# Patient Record
Sex: Female | Born: 1960 | Race: Black or African American | Hispanic: No | Marital: Single | Smoking: Current every day smoker
Health system: Southern US, Community
[De-identification: ages and names within clinical notes are randomized; demographics above are authoritative.]

## PROBLEM LIST (undated history)

## (undated) DIAGNOSIS — F419 Anxiety disorder, unspecified: Secondary | ICD-10-CM

## (undated) DIAGNOSIS — I10 Essential (primary) hypertension: Secondary | ICD-10-CM

## (undated) DIAGNOSIS — J449 Chronic obstructive pulmonary disease, unspecified: Secondary | ICD-10-CM

## (undated) HISTORY — DX: Essential (primary) hypertension: I10

---

## 1983-07-19 HISTORY — PX: RETINAL DETACHMENT SURGERY: SHX105

## 1993-07-18 HISTORY — PX: GANGLION CYST EXCISION: SHX1691

## 1997-11-07 ENCOUNTER — Emergency Department (HOSPITAL_COMMUNITY): Admission: EM | Admit: 1997-11-07 | Discharge: 1997-11-07 | Payer: Self-pay | Admitting: Emergency Medicine

## 1998-07-26 ENCOUNTER — Inpatient Hospital Stay (HOSPITAL_COMMUNITY): Admission: AD | Admit: 1998-07-26 | Discharge: 1998-07-28 | Payer: Self-pay | Admitting: *Deleted

## 1999-10-11 ENCOUNTER — Other Ambulatory Visit: Admission: RE | Admit: 1999-10-11 | Discharge: 1999-10-11 | Payer: Self-pay | Admitting: Obstetrics and Gynecology

## 1999-10-15 ENCOUNTER — Encounter: Payer: Self-pay | Admitting: Obstetrics and Gynecology

## 1999-10-15 ENCOUNTER — Ambulatory Visit (HOSPITAL_COMMUNITY): Admission: RE | Admit: 1999-10-15 | Discharge: 1999-10-15 | Payer: Self-pay | Admitting: Obstetrics and Gynecology

## 1999-11-05 ENCOUNTER — Ambulatory Visit (HOSPITAL_COMMUNITY): Admission: RE | Admit: 1999-11-05 | Discharge: 1999-11-05 | Payer: Self-pay | Admitting: Obstetrics and Gynecology

## 1999-11-05 ENCOUNTER — Encounter: Payer: Self-pay | Admitting: Obstetrics and Gynecology

## 1999-11-17 ENCOUNTER — Encounter (INDEPENDENT_AMBULATORY_CARE_PROVIDER_SITE_OTHER): Payer: Self-pay

## 1999-11-17 ENCOUNTER — Inpatient Hospital Stay (HOSPITAL_COMMUNITY): Admission: AD | Admit: 1999-11-17 | Discharge: 1999-11-19 | Payer: Self-pay | Admitting: Obstetrics and Gynecology

## 1999-11-20 ENCOUNTER — Encounter: Admission: RE | Admit: 1999-11-20 | Discharge: 2000-02-18 | Payer: Self-pay | Admitting: Obstetrics and Gynecology

## 2000-02-19 ENCOUNTER — Encounter: Admission: RE | Admit: 2000-02-19 | Discharge: 2000-05-19 | Payer: Self-pay | Admitting: Obstetrics and Gynecology

## 2000-05-21 ENCOUNTER — Encounter: Admission: RE | Admit: 2000-05-21 | Discharge: 2000-08-19 | Payer: Self-pay | Admitting: Obstetrics and Gynecology

## 2000-08-21 ENCOUNTER — Encounter: Admission: RE | Admit: 2000-08-21 | Discharge: 2000-09-26 | Payer: Self-pay | Admitting: Obstetrics and Gynecology

## 2006-01-30 ENCOUNTER — Encounter: Admission: RE | Admit: 2006-01-30 | Discharge: 2006-01-30 | Payer: Self-pay | Admitting: Internal Medicine

## 2009-11-27 ENCOUNTER — Encounter: Admission: RE | Admit: 2009-11-27 | Discharge: 2009-11-27 | Payer: Self-pay | Admitting: Family Medicine

## 2009-12-07 ENCOUNTER — Encounter: Admission: RE | Admit: 2009-12-07 | Discharge: 2009-12-07 | Payer: Self-pay | Admitting: Family Medicine

## 2010-08-07 ENCOUNTER — Encounter: Payer: Self-pay | Admitting: Internal Medicine

## 2010-08-08 ENCOUNTER — Encounter: Payer: Self-pay | Admitting: Internal Medicine

## 2010-12-03 NOTE — Discharge Summary (Signed)
[facility]  Patient:    Darlene Humphrey, Darlene Humphrey                        MRN: [id number] Adm. Date:  [id number] Disc. Date: [id number] Attending:  Michaele Offer                           Discharge Summary  ADMISSION DIAGNOSES:          Twin pregnancy at 34 weeks, preterm labor, desired surgical sterilization.  DISCHARGE DIAGNOSES:           Twin pregnancy at 34 weeks, preterm labor, desired surgical sterilization.  PROCEDURES:                     Spontaneous vaginal delivery x 2 of vertex-vertex twins and postpartum tubal ligation.  COMPLICATIONS:                None.  CONSULTATIONS:                None.  HISTORY OF PRESENT ILLNESS:   This is a 50 year old black female, gravida 4, para 1-0-2-1 with an EGA of [redacted] weeks by a 22-week ultrasound with an EDC of January 04, 2000, who presented with complaint of contractions for approximately 60 minutes without rupture of membranes with good fetal movement and no vaginal bleeding.  She had a known twin pregnancy and upon presentation to maternity admissions, she was completely dilated with a vertex presentation of twin A and had spontaneous rupture of membranes with possible meconium.  PRENATAL COURSE:              Prenatal care was complicated by late prenatal care, advanced maternal age with normal fetal ultrasound and she did not have a triple screen due to late care and she did not have an amniocentesis.  She had Trichomonas at her first visit on March 26, treated with metronidazole. Ultrasound on March 30 revealed vertex-vertex twins with weights of 1237 g and 1490 g and normal amniotic fluid.  She was last seen in the office on April 9 and fundal height was noted to be 35 cm.  PRENATAL LABORATORY DATA:     Blood type is O positive with a negative antibody screen.  Sickle screen is negative.  RPR nonreactive. Rubella-immune.  Hepatitis B surface antigen negative.  HIV negative. Gonorrhea and Chlamydia  negative and she did not have a one-hour Glucola or group B strep.  PAST OBSTETRICAL HISTORY:     In 1977 elective termination, in 1998 she had a spontaneous abortion, in 2000 she had a vaginal delivery at 40 weeks, 6 pounds 7 ounces without complications.  PAST MEDICAL HISTORY:         Significant for a history of eye surgery and she smoked about a quarter pack of cigarettes a day.  PHYSICAL EXAMINATION:  VITAL SIGNS:                  She is afebrile with stable vital signs.  Fetal heart tracing was reassuring on both twins and on the monitor she had regular contractions.  PELVIC:                       Vaginal examination was complete/complete and +2 by the nurse at maternity admissions.  HOSPITAL COURSE:  The patient presented completely dilated and was immediately brought to the operating room.  Once arrived in the operating room she pushed well.  Baby A delivered vertex and spontaneous delivery, viable female, Apgars of 8 and 9 that weighed 3 pounds 14 ounces with a nuchal cord which had delivered through.  Baby B was initially oblique and external cephalic version was successfully performed to bring the baby to vertex presentation with reassuring fetal heart tracing.  Artificial rupture of membranes the revealed clear fluid.  She then had a SVD of a viable female, Apgars 8 and 9 with a weight 4 pounds 6 ounces.  This baby had a nuchal cord which was reduced.  Placenta delivered spontaneous and was intact and did appear to one placenta.  The perineum had some small lacerations which were hemostatic and not repaired.  Estimated blood loss was 600 cc.  The neonatal team was present for both deliveries and both babies were taken to the neonatal intensive care unit in stable condition.  The patient did desire tubal ligation and after risks and options were discussed, she wished to proceed with this on postpartum day #1.  Predelivery hemoglobin was 12.0, post delivery was  9.8.  On May 3 she had a postpartum tubal ligation done under general anesthesia without complications.  On the morning of May 4 she was breast feeding her babies without complications.  Her tubal incision was healing well and she was felt to be stable enough for discharge home.  CONDITION ON DISCHARGE:       Stable.  DISPOSITION:                  Discharged to home.  DISCHARGE INSTRUCTIONS:       Her diet is a regular diet.  Her activity is pelvic rest.  FOLLOW-UP:                    Her follow-up is in four to six weeks.  DISCHARGE MEDICATIONS:        Percocet p.r.n. pain and she is given our discharge summary pamphlet. DD:  11/19/99 TD:  11/22/99 Job: [id number]

## 2010-12-03 NOTE — Op Note (Signed)
[facility]  Patient:    Darlene Humphrey, Darlene Humphrey                        MRN: [id number] Proc. Date: 11/18/99 Adm. Date:  [id number] Attending:  Michaele Offer                           Operative Report  PREOPERATIVE DIAGNOSIS:       Desired surgical sterility.  POSTOPERATIVE DIAGNOSIS:      Desired surgical sterility.  OPERATION:                    Bilateral partial salpingectomy.  SURGEON:                      Zenaida Niece, M.D.  ASSISTANT:  ANESTHESIA:                   General endotracheal anesthesia.  ESTIMATED BLOOD LOSS:         Less than 50 cc.  FINDINGS:                     Normal female anatomy.  COUNTS:                       Correct.  CONDITION:                    Stable.  DESCRIPTION OF PROCEDURE:     After appropriate informed consent was obtained, he patient was taken to the operating room and placed in the dorsal supine position. General anesthesia was induced and her abdomen was prepped and draped in the usual sterile fashion.  A 3 cm infraumbilical skin incision was made after the infraumbilical skin was infiltrated with 0.25% Marcaine.  The fascia was identified and entered sharply.  This also entered the peritoneal cavity.  Both fallopian tubes were identified and traced to their fimbriated ends.  A knuckle of tube was ligated with 0 plain gut suture on each side.  These knuckles of tube were removed sharply.  On both sides, both ostia were identified and the stumps were hemostatic. The fascia was closed with running 0 Vicryl and the skin closed with a running subcuticular suture of 3-0 Vicryl followed by a Band-aid.  The patient was awakened in the operating room, tolerated the procedure well, and was taken to the recovery room in stable condition. DD:  11/18/99 TD:  11/18/99 Job: [id number]

## 2013-07-29 ENCOUNTER — Other Ambulatory Visit: Payer: Self-pay | Admitting: Family Medicine

## 2013-07-29 ENCOUNTER — Other Ambulatory Visit: Payer: Self-pay

## 2013-07-29 ENCOUNTER — Ambulatory Visit
Admission: RE | Admit: 2013-07-29 | Discharge: 2013-07-29 | Disposition: A | Discharge status: Home / Self Care | Payer: Medicaid Other | Source: Ambulatory Visit | Attending: Family Medicine | Admitting: Family Medicine

## 2013-07-29 ENCOUNTER — Ambulatory Visit
Admission: RE | Admit: 2013-07-29 | Discharge: 2013-07-29 | Disposition: A | Discharge status: Home / Self Care | Payer: Medicaid Other | Source: Ambulatory Visit

## 2013-07-29 DIAGNOSIS — I10 Essential (primary) hypertension: Secondary | ICD-10-CM

## 2013-07-29 DIAGNOSIS — Z1231 Encounter for screening mammogram for malignant neoplasm of breast: Secondary | ICD-10-CM

## 2013-07-29 DIAGNOSIS — J209 Acute bronchitis, unspecified: Secondary | ICD-10-CM

## 2013-07-30 ENCOUNTER — Other Ambulatory Visit: Payer: Self-pay | Admitting: Family Medicine

## 2013-07-30 DIAGNOSIS — R9389 Abnormal findings on diagnostic imaging of other specified body structures: Secondary | ICD-10-CM

## 2013-07-30 DIAGNOSIS — H43399 Other vitreous opacities, unspecified eye: Secondary | ICD-10-CM

## 2013-08-02 ENCOUNTER — Ambulatory Visit
Admission: RE | Admit: 2013-08-02 | Discharge: 2013-08-02 | Disposition: A | Discharge status: Home / Self Care | Payer: Self-pay | Source: Ambulatory Visit | Attending: Family Medicine | Admitting: Family Medicine

## 2013-08-02 DIAGNOSIS — R9389 Abnormal findings on diagnostic imaging of other specified body structures: Secondary | ICD-10-CM

## 2013-08-21 LAB — CYTOLOGY - PAP: Pap: NEGATIVE

## 2013-08-28 ENCOUNTER — Encounter: Payer: Self-pay | Admitting: Gastroenterology

## 2013-10-10 ENCOUNTER — Ambulatory Visit (AMBULATORY_SURGERY_CENTER): Payer: Self-pay | Admitting: *Deleted

## 2013-10-10 VITALS — Ht 70.0 in | Wt 171.6 lb

## 2013-10-10 DIAGNOSIS — Z1211 Encounter for screening for malignant neoplasm of colon: Secondary | ICD-10-CM

## 2013-10-10 MED ORDER — NA SULFATE-K SULFATE-MG SULF 17.5-3.13-1.6 GM/177ML PO SOLN: 1.0000 | Freq: Once | ORAL | Status: DC

## 2013-10-10 NOTE — Progress Notes (Signed)
No allergies to eggs or soy. No problems with anesthesia.  

## 2013-10-24 ENCOUNTER — Encounter: Payer: Self-pay | Admitting: Gastroenterology

## 2013-10-24 ENCOUNTER — Ambulatory Visit (AMBULATORY_SURGERY_CENTER): Payer: Medicaid Other | Admitting: Gastroenterology

## 2013-10-24 VITALS — BP 147/81 | HR 54 | Temp 98.0°F | Resp 17 | Ht 70.0 in | Wt 171.0 lb

## 2013-10-24 DIAGNOSIS — D126 Benign neoplasm of colon, unspecified: Secondary | ICD-10-CM

## 2013-10-24 DIAGNOSIS — Z1211 Encounter for screening for malignant neoplasm of colon: Secondary | ICD-10-CM

## 2013-10-24 DIAGNOSIS — K5730 Diverticulosis of large intestine without perforation or abscess without bleeding: Secondary | ICD-10-CM

## 2013-10-24 MED ORDER — SODIUM CHLORIDE 0.9 % IV SOLN: 500.0000 mL | INTRAVENOUS | Status: DC

## 2013-10-24 NOTE — Progress Notes (Signed)
Called to room to assist during endoscopic procedure.  Patient ID and intended procedure confirmed with present staff. Received instructions for my participation in the procedure from the performing physician.  

## 2013-10-24 NOTE — Patient Instructions (Signed)
YOU HAD AN ENDOSCOPIC PROCEDURE TODAY AT THE [facility]: Refer to the procedure report that was given to you for any specific questions about what was found during the examination.  If the procedure report does not answer your questions, please call your gastroenterologist to clarify.  If you requested that your care partner not be given the details of your procedure findings, then the procedure report has been included in a sealed envelope for you to review at your convenience later.  YOU SHOULD EXPECT: Some feelings of bloating in the abdomen. Passage of more gas than usual.  Walking can help get rid of the air that was put into your GI tract during the procedure and reduce the bloating. If you had a lower endoscopy (such as a colonoscopy or flexible sigmoidoscopy) you may notice spotting of blood in your stool or on the toilet paper. If you underwent a bowel prep for your procedure, then you may not have a normal bowel movement for a few days.  DIET: Your first meal following the procedure should be a light meal and then it is ok to progress to your normal diet.  A half-sandwich or bowl of soup is an example of a good first meal.  Heavy or fried foods are harder to digest and may make you feel nauseous or bloated.  Likewise meals heavy in dairy and vegetables can cause extra gas to form and this can also increase the bloating.  Drink plenty of fluids but you should avoid alcoholic beverages for 24 hours.  ACTIVITY: Your care partner should take you home directly after the procedure.  You should plan to take it easy, moving slowly for the rest of the day.  You can resume normal activity the day after the procedure however you should NOT DRIVE or use heavy machinery for 24 hours (because of the sedation medicines used during the test).    SYMPTOMS TO REPORT IMMEDIATELY: A gastroenterologist can be reached at any hour.  During normal business hours, 8:30 AM to 5:00 PM Monday through Friday,  call [phone].  After hours and on weekends, please call the GI answering service at [phone] who will take a message and have the physician on call contact you.   Following lower endoscopy (colonoscopy or flexible sigmoidoscopy):  Excessive amounts of blood in the stool  Significant tenderness or worsening of abdominal pains  Swelling of the abdomen that is new, acute  Fever of 100F or higher   FOLLOW UP: If any biopsies were taken you will be contacted by phone or by letter within the next 1-3 weeks.  Call your gastroenterologist if you have not heard about the biopsies in 3 weeks.  Our staff will call the home number listed on your records the next business day following your procedure to check on you and address any questions or concerns that you may have at that time regarding the information given to you following your procedure. This is a courtesy call and so if there is no answer at the home number and we have not heard from you through the emergency physician on call, we will assume that you have returned to your regular daily activities without incident.  SIGNATURES/CONFIDENTIALITY: You and/or your care partner have signed paperwork which will be entered into your electronic medical record.  These signatures attest to the fact that that the information above on your After Visit Summary has been reviewed and is understood.  Full responsibility of the confidentiality of   this discharge information lies with you and/or your care-partner.   Resume medications. Information given on polyps,diverticulosis and high fiber diet with discharge instructions. 

## 2013-10-24 NOTE — Progress Notes (Signed)
A/ox3 pleased with MAC, report to Sheila RN 

## 2013-10-24 NOTE — Op Note (Signed)
[facility]  [address] [place], [id number]   COLONOSCOPY PROCEDURE REPORT  PATIENT: Darlene Humphrey, Darlene Humphrey  MR#: [id number] BIRTHDATE: 05-04-1961 , 53  yrs. old GENDER: Female ENDOSCOPIST: Inda Castle, MD REFERRED LN:[id number] Blount, M.D. PROCEDURE DATE:  10/24/2013 PROCEDURE:   Colonoscopy with snare polypectomy and Colonoscopy with biopsy First Screening Colonoscopy - Avg.  risk and is 50 yrs.  old or older Yes.  Prior Negative Screening - Now for repeat screening. N/A  History of Adenoma - Now for follow-up colonoscopy & has been > or = to 3 yrs.  N/A  Polyps Removed Today? Yes. ASA CLASS:   Class II INDICATIONS:average risk screening. MEDICATIONS: MAC sedation, administered by CRNA and propofol (Diprivan) 350mg  IV  DESCRIPTION OF PROCEDURE:   After the risks benefits and alternatives of the procedure were thoroughly explained, informed consent was obtained.  A digital rectal exam revealed no abnormalities of the rectum.   The [id number]  endoscope was introduced through the anus and advanced to the cecum, which was identified by both the appendix and ileocecal valve. No adverse events experienced.   The quality of the prep was Suprep good  The instrument was then slowly withdrawn as the colon was fully examined.      COLON FINDINGS: Two sessile polyps ranging between 3-52mm in size were found in the transverse colon.  A polypectomy was performed. The resection was complete and the polyp tissue was completely retrieved.   Multiple sessile polyps were found in the sigmoid colon.   Mild diverticulosis was noted in the sigmoid colon.   The colon mucosa was otherwise normal. measuring 5-12 mm.  The largest polyp was removed with cold snare and submitted to pathology. Biopsies were taken from the other polyps to rule out adenomatous changes versus hyperplastic changes. Retroflexed views revealed no abnormalities. The time to cecum=3 minutes 35 seconds.   Withdrawal time=19 minutes 43 seconds.  The scope was withdrawn and the procedure completed. COMPLICATIONS: There were no complications.  ENDOSCOPIC IMPRESSION: 1.   Two sessile polyps ranging between 3-25mm in size were found in the transverse colon; polypectomy was performed 2.   Multiple sessile polyps were found in the sigmoid colon (hyperplastic versus adenomatous) 3.   Mild diverticulosis was noted in the sigmoid colon 4.   The colon mucosa was otherwise normal  RECOMMENDATIONS: If the polyp(s) in the transverse colon removed today are proven to be adenomatous (pre-cancerous) polyps, you will need a repeat colonoscopy in 5 years.  Otherwise you should continue to follow colorectal cancer screening guidelines for "routine risk" patients with colonoscopy in 10 years. If polyps in the sigmoid colon are precancerous, you'll require a followup sigmoidoscopy within 3 months to remove these polyps. You will receive a letter within 1-2 weeks with the results of your biopsy as well. as final recommendations.  Please call my office if you have not received a letter after 3 weeks.   eSigned:  Inda Castle, MD 10/24/2013 10:46 AM   cc:   PATIENT NAME:  Darlene Humphrey, Darlene Humphrey MR#: [id number]

## 2013-10-25 ENCOUNTER — Telehealth: Payer: Self-pay | Admitting: *Deleted

## 2013-10-25 NOTE — Telephone Encounter (Signed)
  Follow up Call-  Call back number 10/24/2013  Post procedure Call Back phone  # [phone]  Permission to leave phone message No     Patient questions:  Do you have a fever, pain , or abdominal swelling? no Pain Score  0 *  Have you tolerated food without any problems? yes  Have you been able to return to your normal activities? yes  Do you have any questions about your discharge instructions: Diet   no Medications  no Follow up visit  no  Do you have questions or concerns about your Care? no  Actions: * If pain score is 4 or above: No action needed, pain <4.

## 2013-10-29 ENCOUNTER — Encounter: Payer: Self-pay | Admitting: Gastroenterology

## 2015-06-02 IMAGING — CT CT CHEST W/O CM
3 of 4 series · 17 of 30 positions shown, 19 images · non-contrast
Comparison: DG CHEST 2 VIEW dated 07/29/2013; DG CHEST 2 VIEW dated
11/27/2009

CLINICAL DATA: Tobacco use. Abnormal chest radiograph showing
opacity projecting over the cardiac shadow.

EXAM:
CT CHEST WITHOUT CONTRAST
TECHNIQUE: Multidetector CT imaging of the chest was performed following the
standard protocol without IV contrast.

[Series 3: chest w/o · axial · non-contrast · 0.77mm/px · z∈[-265,-55]mm · 4 of 72 slices shown]
[im 15/72  lung]
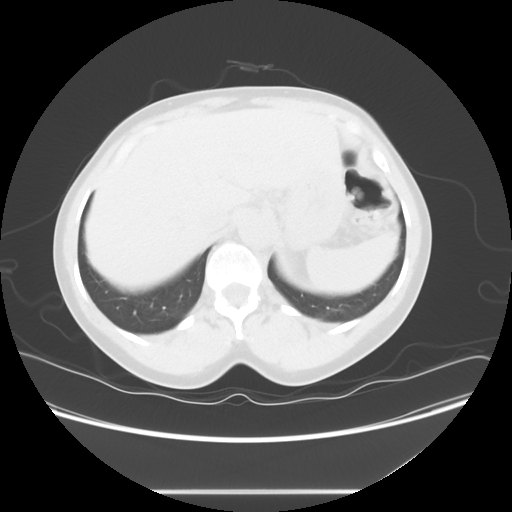
[im 29/72  lung]
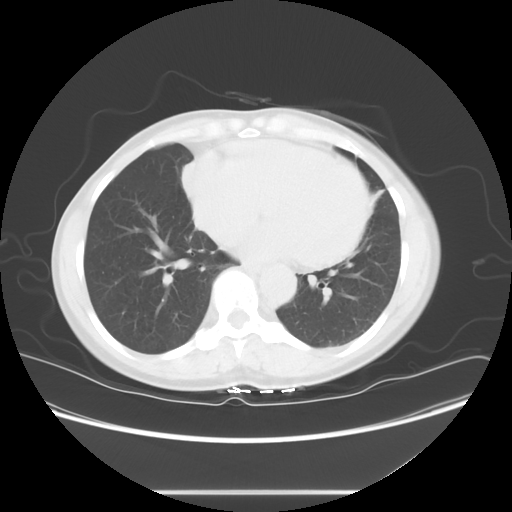
[im 43/72  lung]
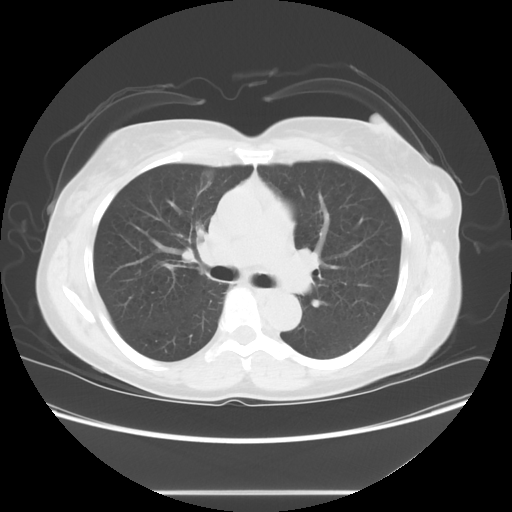
[im 57/72  lung]
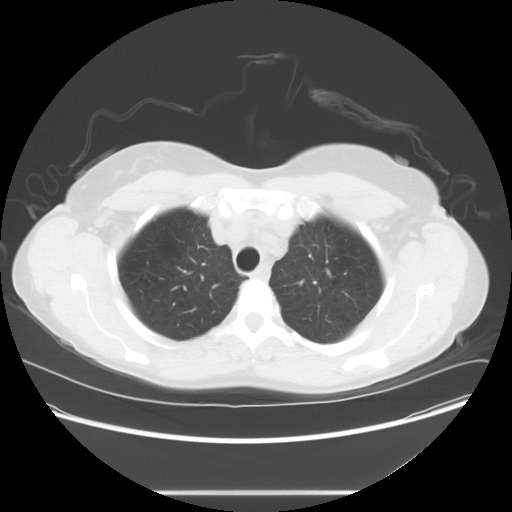

[Series 4: lung windows · axial · 0.77mm/px · z∈[-280,-40]mm · 5 of 72 slices shown, 7 images]
[im 12/72  mediastinal]
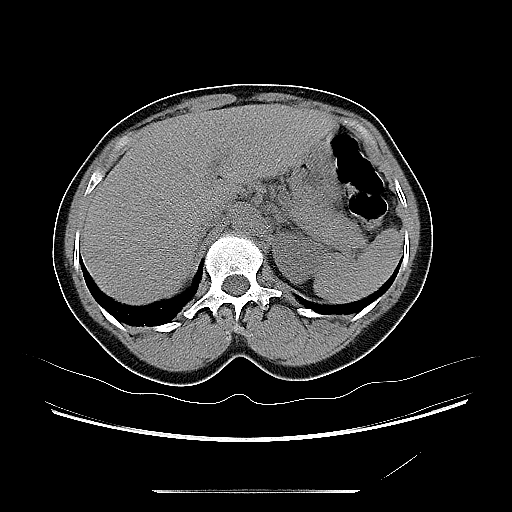
[im 12/72  lung]
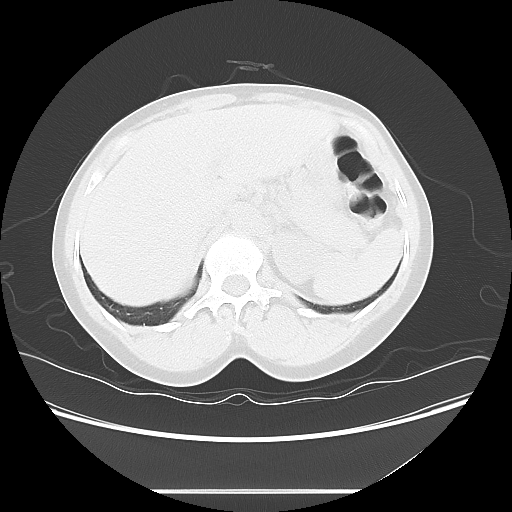
[im 24/72  lung]
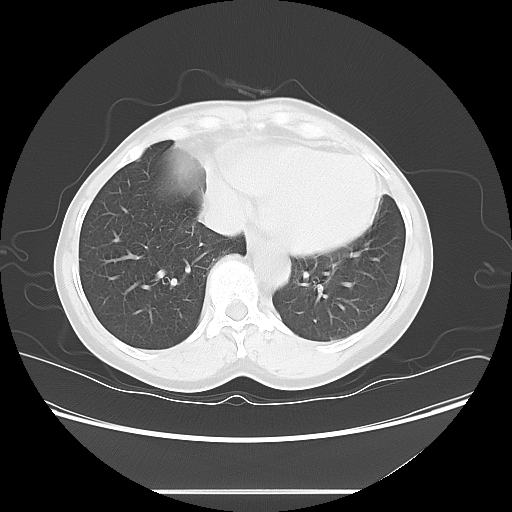
[im 36/72  lung]
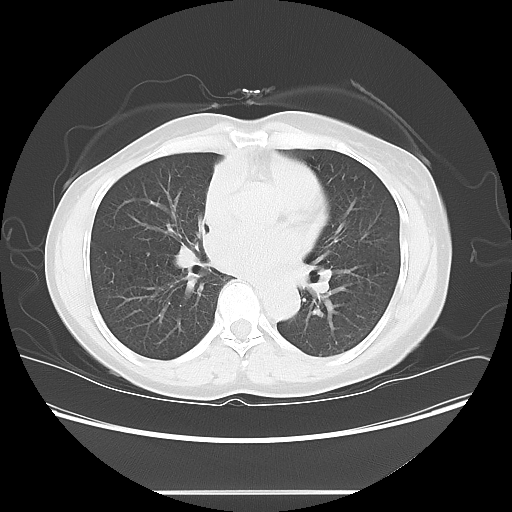
[im 48/72  lung]
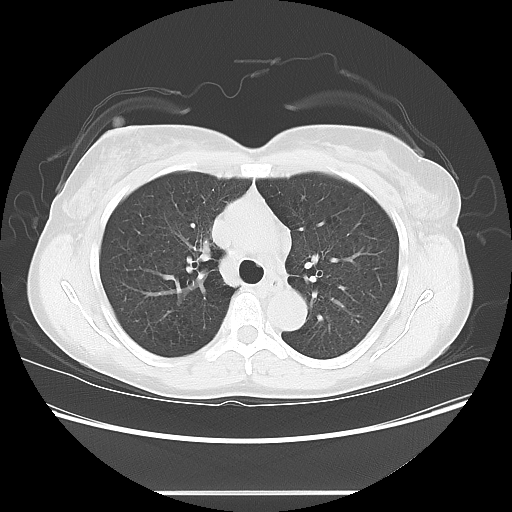
[im 60/72  mediastinal]
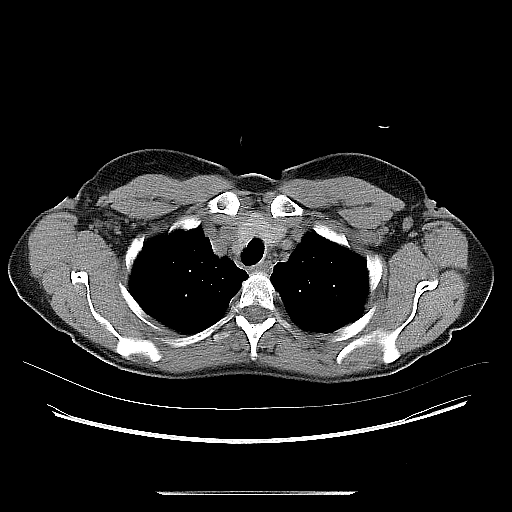
[im 60/72  lung]
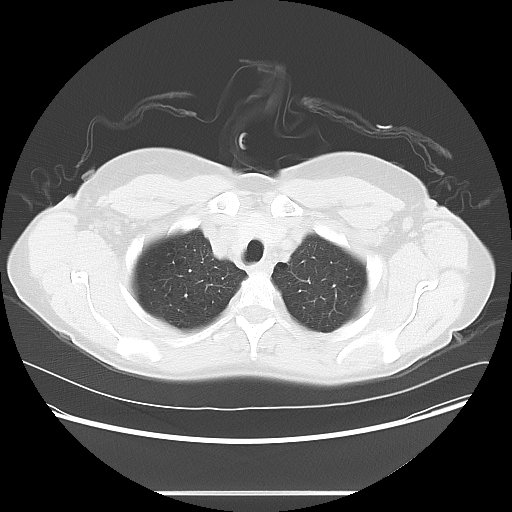

[Series 602: sagittal body · sagittal · 0.77mm/px · 8 of 158 slices shown]
[im 12/158  mediastinal]
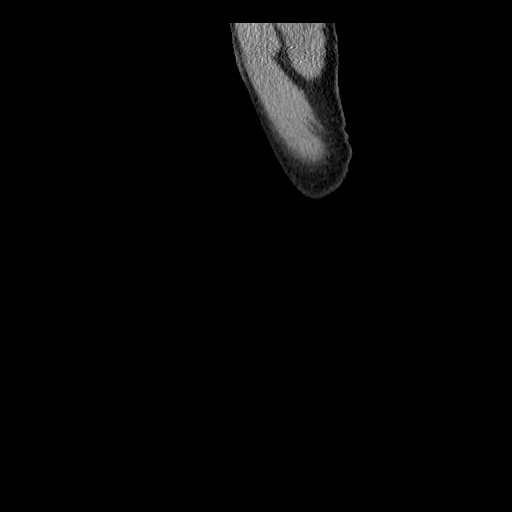
[im 34/158  mediastinal]
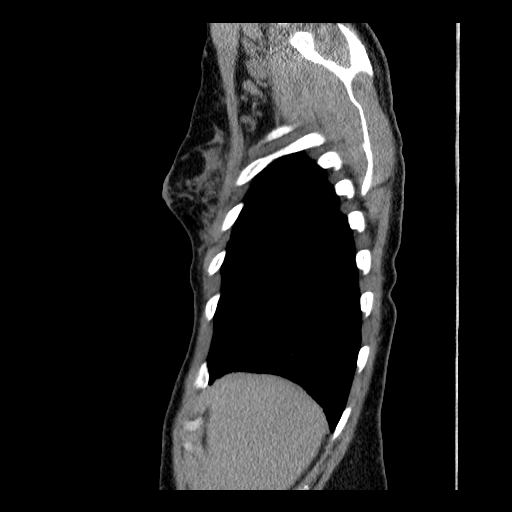
[im 57/158  mediastinal]
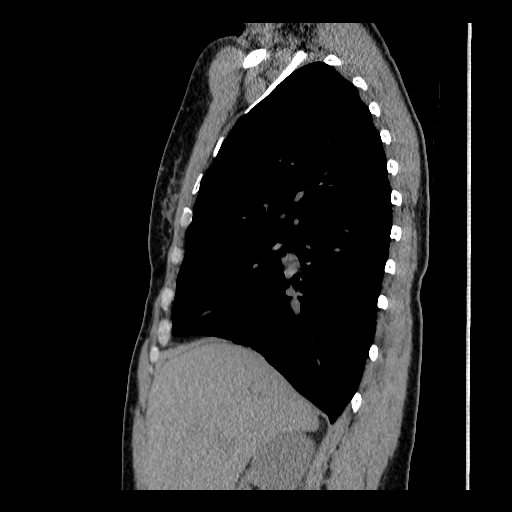
[im 68/158  mediastinal]
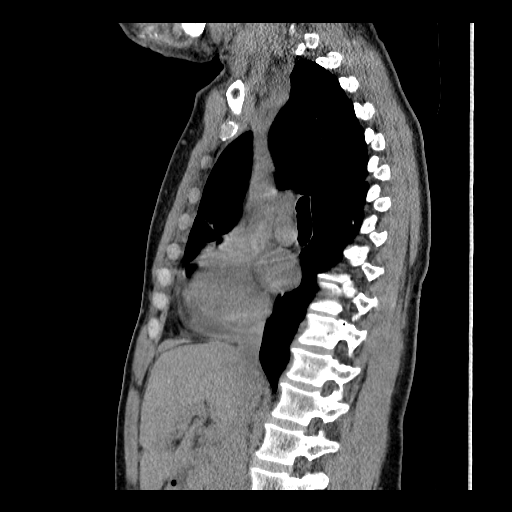
[im 90/158  mediastinal]
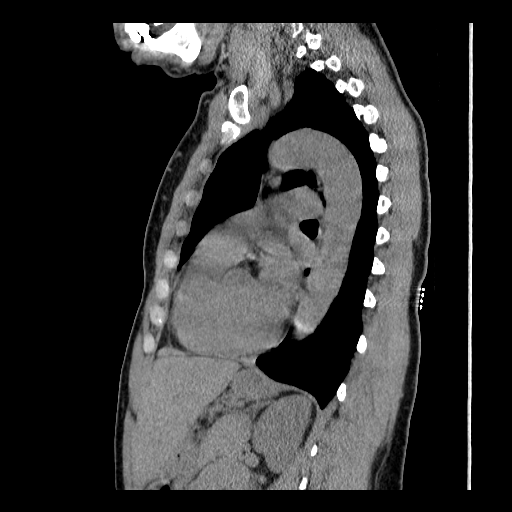
[im 101/158  mediastinal]
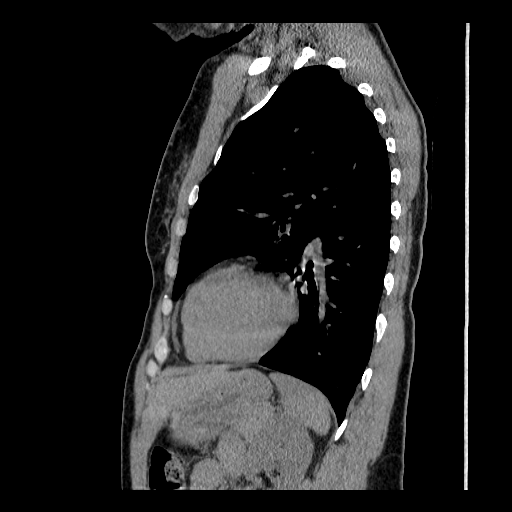
[im 124/158  mediastinal]
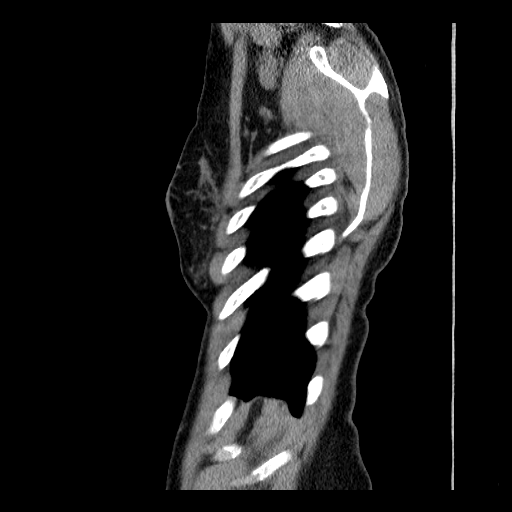
[im 146/158  mediastinal]
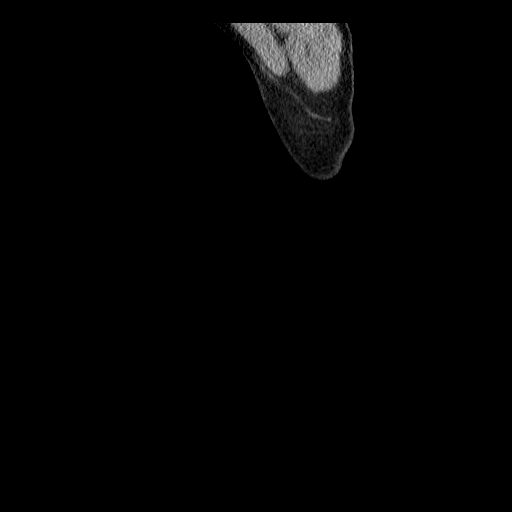

[17 of 30 positions shown; findings below may reference images not displayed]

FINDINGS: Scattered small mediastinal lymph nodes not pathologically enlarged
by size criteria. The heart is mildly enlarged. No pericardial
effusion. No pleural effusion.

Diffuse centrilobular emphysema. There is mild atelectasis medially
in the right middle lobe and in the lingula, and the combination of
the two in conjunction with epicardial adipose tissue is thought to
correlate with the density seen on the lateral chest radiography. No
bronchial lesion leading to these regions of atelectasis is
observed.

Tiny subpleural lymph node along the right major fissure
incidentally noted.

There is a 4 mm peripheral pulmonary nodule in the left lower lobe.

Lower cervical spondylosis noted.
IMPRESSION: 1. The opacity questioned on chest radiography represents
combination of mild lingular and right middle lobe atelectasis in
conjunction with epicardial adipose tissue. No definite specific
lesion causing this mild atelectasis is observed.
2. Centrilobular emphysema.
3. 4 mm peripheral left lower lobe lung nodule. Follow-up chest CT
at 8year is recommended. This recommendation follows the consensus
statement: Guidelines for Management of Small Pulmonary Nodules
Detected on CT Scans: A Statement from the [HOSPITAL] as
published in Radiology 0555; [DATE].
4. Mild cardiomegaly.

## 2015-08-11 ENCOUNTER — Encounter (HOSPITAL_COMMUNITY): Payer: Self-pay | Admitting: *Deleted

## 2015-08-11 ENCOUNTER — Emergency Department (HOSPITAL_COMMUNITY)
Admission: EM | Admit: 2015-08-11 | Discharge: 2015-08-11 | Disposition: A | Discharge status: Home / Self Care | Payer: Medicaid Other | Source: Home / Self Care

## 2015-08-11 DIAGNOSIS — S61011A Laceration without foreign body of right thumb without damage to nail, initial encounter: Secondary | ICD-10-CM | POA: Diagnosis not present

## 2015-08-11 MED ORDER — LIDOCAINE HCL 2 % IJ SOLN
INTRAMUSCULAR | Status: AC
  Filled 2015-08-11: qty 20

## 2015-08-11 NOTE — Discharge Instructions (Signed)
Laceration Care, Adult  A laceration is a cut that goes through all layers of the skin. The cut also goes into the tissue that is right under the skin. Some cuts heal on their own. Others need to be closed with stitches (sutures), staples, skin adhesive strips, or wound glue. Taking care of your cut lowers your risk of infection and helps your cut to heal better.  HOW TO TAKE CARE OF YOUR CUT  For stitches or staples:  · Keep the wound clean and dry.  · If you were given a bandage (dressing), you should change it at least one time per day or as told by your doctor. You should also change it if it gets wet or dirty.  · Keep the wound completely dry for the first 24 hours or as told by your doctor. After that time, you may take a shower or a bath. However, make sure that the wound is not soaked in water until after the stitches or staples have been removed.  · Clean the wound one time each day or as told by your doctor:    Wash the wound with soap and water.    Rinse the wound with water until all of the soap comes off.    Pat the wound dry with a clean towel. Do not rub the wound.  · After you clean the wound, put a thin layer of antibiotic ointment on it as told by your doctor. This ointment:    Helps to prevent infection.    Keeps the bandage from sticking to the wound.  · Have your stitches or staples removed as told by your doctor.  If your doctor used skin adhesive strips:   · Keep the wound clean and dry.  · If you were given a bandage, you should change it at least one time per day or as told by your doctor. You should also change it if it gets dirty or wet.  · Do not get the skin adhesive strips wet. You can take a shower or a bath, but be careful to keep the wound dry.  · If the wound gets wet, pat it dry with a clean towel. Do not rub the wound.  · Skin adhesive strips fall off on their own. You can trim the strips as the wound heals. Do not remove any strips that are still stuck to the wound. They will  fall off after a while.  If your doctor used wound glue:  · Try to keep your wound dry, but you may briefly wet it in the shower or bath. Do not soak the wound in water, such as by swimming.  · After you take a shower or a bath, gently pat the wound dry with a clean towel. Do not rub the wound.  · Do not do any activities that will make you really sweaty until the skin glue has fallen off on its own.  · Do not apply liquid, cream, or ointment medicine to your wound while the skin glue is still on.  · If you were given a bandage, you should change it at least one time per day or as told by your doctor. You should also change it if it gets dirty or wet.  · If a bandage is placed over the wound, do not let the tape for the bandage touch the skin glue.  · Do not pick at the glue. The skin glue usually stays on for 5-10 days. Then, it   or when wound glue stays in place and the wound is healed. Make sure to wear a sunscreen of at least 30 SPF.  Take over-the-counter and prescription medicines only as told by your doctor.  If you were given antibiotic medicine or ointment, take or apply it as told by your doctor. Do not stop using the antibiotic even if your wound is getting better.  Do not scratch or pick at the wound.  Keep all follow-up visits as told by your doctor. This is important.  Check your wound every day for signs of infection. Watch for:  Redness, swelling, or pain.  Fluid, blood, or pus.  Raise (elevate) the injured area above the level of your heart while you are sitting or lying down, if possible. GET HELP IF:  You got a tetanus shot and you have any of these problems at the injection site:  Swelling.  Very bad pain.  Redness.  Bleeding.  You have a fever.  A wound that was  closed breaks open.  You notice a bad smell coming from your wound or your bandage.  You notice something coming out of the wound, such as wood or glass.  Medicine does not help your pain.  You have more redness, swelling, or pain at the site of your wound.  You have fluid, blood, or pus coming from your wound.  You notice a change in the color of your skin near your wound.  You need to change the bandage often because fluid, blood, or pus is coming from the wound.  You start to have a new rash.  You start to have numbness around the wound. GET HELP RIGHT AWAY IF:  You have very bad swelling around the wound.  Your pain suddenly gets worse and is very bad.  You notice painful lumps near the wound or on skin that is anywhere on your body.  You have a red streak going away from your wound.  The wound is on your hand or foot and you cannot move a finger or toe like you usually can.  The wound is on your hand or foot and you notice that your fingers or toes look pale or bluish.   This information is not intended to replace advice given to you by your health care provider. Make sure you discuss any questions you have with your health care provider.   Nice to meet you. Please keep big dressing in place for 48 hours, then can remove top layer and let water gently run over it. Do not submerge, then keep dry and covered until f/u in 7 days for suture removal. May take Ibuprofen 800mg  every 8 hours and ES tylenol every 4 as needed.    Document Released: 12/21/2007 Document Revised: 11/18/2014 Document Reviewed: 06/30/2014 Elsevier Interactive Patient Education [employer].

## 2015-08-11 NOTE — ED Notes (Signed)
Pt  Reports     Laceration       To    r  Thumb     Sustained   When  She  Was  Moving a  Washing  Machine       And  Snagged  It  On  Metal  Edge

## 2015-08-11 NOTE — ED Provider Notes (Signed)
CSN: [id number]     Arrival date & time 08/11/15  1854 History   None    Chief Complaint  Patient presents with  . Laceration   (Consider location/radiation/quality/duration/timing/severity/associated sxs/prior Treatment) HPI Comments: Patient presents with laceration to right thumb while trying to move a washing machine and snagged it on metal edge.   The history is provided by the patient.    Past Medical History  Diagnosis Date  . Hypertension    Past Surgical History  Procedure Laterality Date  . Ganglion cyst excision Right 1995    wrist  . Retinal detachment surgery Left 1985   Family History  Problem Relation Age of Onset  . Colon cancer Neg Hx    Social History  Substance Use Topics  . Smoking status: Current Every Day Smoker -- 0.25 packs/day    Types: Cigarettes  . Smokeless tobacco: Never Used  . Alcohol Use: 1.8 oz/week    3 Cans of beer per week   OB History    No data available     Review of Systems  All other systems reviewed and are negative.   Allergies  Review of patient's allergies indicates no known allergies.  Home Medications   Prior to Admission medications   Medication Sig Start Date End Date Taking? Authorizing Provider  furosemide (LASIX) 20 MG tablet Take 20 mg by mouth daily.    Historical Provider, MD  lisinopril (PRINIVIL,ZESTRIL) 20 MG tablet Take 20 mg by mouth daily.    Historical Provider, MD  Multiple Vitamin (MULTIVITAMIN) tablet Take 1 tablet by mouth daily.    Historical Provider, MD   Meds Ordered and Administered this Visit  Medications - No data to display  There were no vitals taken for this visit. No data found.   Physical Exam  Constitutional: She is oriented to person, place, and time. She appears well-developed and well-nourished. No distress.  Musculoskeletal: Normal range of motion.  Full ROM of the right thumb at IP joint  Neurological: She is alert and oriented to person, place, and time.  Sensation  intact to right thumb  Skin: Skin is warm and dry. She is not diaphoretic.     3cm laceration to the right inner thumb palmer surface  Psychiatric: Her behavior is normal.  Nursing note and vitals reviewed.   ED Course  .Marland KitchenLaceration Repair Date/Time: 08/11/2015 8:29 PM Performed by: Bjorn Pippin Authorized by: Bjorn Pippin Consent: Verbal consent obtained. Written consent obtained. Risks and benefits: risks, benefits and alternatives were discussed Consent given by: patient Patient understanding: patient states understanding of the procedure being performed Patient identity confirmed: verbally with patient Body area: upper extremity Location details: right thumb Laceration length: 3 cm Foreign bodies: no foreign bodies Tendon involvement: none Nerve involvement: none Vascular damage: no Anesthesia: local infiltration Local anesthetic: lidocaine 1% without epinephrine Anesthetic total: 0.5 ml Preparation: Patient was prepped and draped in the usual sterile fashion. Irrigation solution: saline Irrigation method: syringe Amount of cleaning: standard Debridement: none Skin closure: 6-0 nylon and 4-0 nylon Number of sutures: 5 Technique: simple Approximation: loose Approximation difficulty: simple Dressing: 4x4 sterile gauze Patient tolerance: Patient tolerated the procedure well with no immediate complications   (including critical care time)  Labs Review Labs Reviewed - No data to display  Imaging Review No results found.   Visual Acuity Review  Right Eye Distance:   Left Eye Distance:   Bilateral Distance:    Right Eye Near:   Left Eye Near:  Bilateral Near:         MDM   1. Thumb laceration, right, initial encounter    Simple closure with 5 sutures. No tendon or nerve involvement. F/U in 7 days for removal. Td up to date.        Bjorn Pippin, PA-C 08/11/15 2122

## 2015-08-18 ENCOUNTER — Emergency Department (HOSPITAL_COMMUNITY)
Admission: EM | Admit: 2015-08-18 | Discharge: 2015-08-18 | Disposition: A | Discharge status: Home / Self Care | Payer: Medicaid Other | Source: Home / Self Care | Attending: Family Medicine | Admitting: Family Medicine

## 2015-08-18 ENCOUNTER — Encounter (HOSPITAL_COMMUNITY): Payer: Self-pay | Admitting: *Deleted

## 2015-08-18 DIAGNOSIS — Z4802 Encounter for removal of sutures: Secondary | ICD-10-CM | POA: Diagnosis not present

## 2015-08-18 NOTE — ED Provider Notes (Signed)
CSN: [id number]     Arrival date & time 08/18/15  1300 History   First MD Initiated Contact with Patient 08/18/15 1306     Chief Complaint  Patient presents with  . Suture / Staple Removal   (Consider location/radiation/quality/duration/timing/severity/associated sxs/prior Treatment) Patient is a 55 y.o. female presenting with suture removal. The history is provided by the patient.  Suture / Staple Removal This is a new problem. The current episode started more than 1 week ago. The problem has been rapidly improving. Associated symptoms comments: Lac to right thumb doing well..    Past Medical History  Diagnosis Date  . Hypertension    Past Surgical History  Procedure Laterality Date  . Ganglion cyst excision Right 1995    wrist  . Retinal detachment surgery Left 1985   Family History  Problem Relation Age of Onset  . Colon cancer Neg Hx    Social History  Substance Use Topics  . Smoking status: Current Every Day Smoker -- 0.25 packs/day    Types: Cigarettes  . Smokeless tobacco: Never Used  . Alcohol Use: 1.8 oz/week    3 Cans of beer per week   OB History    No data available     Review of Systems  Musculoskeletal: Negative.   Skin: Positive for wound.  All other systems reviewed and are negative.   Allergies  Review of patient's allergies indicates no known allergies.  Home Medications   Prior to Admission medications   Medication Sig Start Date End Date Taking? Authorizing Provider  furosemide (LASIX) 20 MG tablet Take 20 mg by mouth daily.    Historical Provider, MD  lisinopril (PRINIVIL,ZESTRIL) 20 MG tablet Take 20 mg by mouth daily.    Historical Provider, MD  Multiple Vitamin (MULTIVITAMIN) tablet Take 1 tablet by mouth daily.    Historical Provider, MD   Meds Ordered and Administered this Visit  Medications - No data to display  BP 137/88 mmHg  Pulse 64  Temp(Src) 98 F (36.7 C) (Oral)  Resp 16  SpO2 98% No data found.   Physical Exam   Constitutional: She is oriented to person, place, and time. She appears well-developed and well-nourished.  Musculoskeletal: She exhibits no tenderness.  Lac to right thumb healed, 5 stitches removed.  Neurological: She is alert and oriented to person, place, and time.  Skin: Skin is warm and dry.  Nursing note and vitals reviewed.   ED Course  Procedures (including critical care time)  Labs Review Labs Reviewed - No data to display  Imaging Review No results found.   Visual Acuity Review  Right Eye Distance:   Left Eye Distance:   Bilateral Distance:    Right Eye Near:   Left Eye Near:    Bilateral Near:         MDM   1. Encounter for removal of sutures    5 stitches removed     Billy Fischer, MD 08/18/15 1332

## 2015-08-18 NOTE — ED Notes (Signed)
Pt  Here  For  Suture  Removal   Sutures  Have  Been in  X  1  Week

## 2015-08-18 NOTE — Discharge Instructions (Signed)
Return if needed

## 2016-04-13 ENCOUNTER — Encounter: Payer: Self-pay | Admitting: *Deleted

## 2017-03-27 ENCOUNTER — Other Ambulatory Visit: Payer: Self-pay | Admitting: Family Medicine

## 2017-03-27 DIAGNOSIS — Z1231 Encounter for screening mammogram for malignant neoplasm of breast: Secondary | ICD-10-CM

## 2017-04-11 ENCOUNTER — Ambulatory Visit: Payer: Self-pay

## 2017-04-27 ENCOUNTER — Ambulatory Visit: Payer: Self-pay

## 2018-11-23 ENCOUNTER — Encounter: Payer: Self-pay | Admitting: Gastroenterology

## 2020-01-23 ENCOUNTER — Other Ambulatory Visit (HOSPITAL_COMMUNITY)
Admission: RE | Admit: 2020-01-23 | Discharge: 2020-01-23 | Disposition: A | Discharge status: Home / Self Care | Payer: BC Managed Care – PPO | Source: Ambulatory Visit | Attending: Family Medicine | Admitting: Family Medicine

## 2020-01-23 ENCOUNTER — Other Ambulatory Visit: Payer: Self-pay | Admitting: Family Medicine

## 2020-01-23 DIAGNOSIS — Z124 Encounter for screening for malignant neoplasm of cervix: Secondary | ICD-10-CM | POA: Insufficient documentation

## 2020-01-24 LAB — CYTOLOGY - PAP
Adequacy: ABSENT
Diagnosis: NEGATIVE

## 2020-02-14 ENCOUNTER — Ambulatory Visit
Admission: RE | Admit: 2020-02-14 | Discharge: 2020-02-14 | Disposition: A | Discharge status: Home / Self Care | Payer: BC Managed Care – PPO | Source: Ambulatory Visit | Attending: Family Medicine | Admitting: Family Medicine

## 2020-02-14 ENCOUNTER — Other Ambulatory Visit: Payer: Self-pay | Admitting: Family Medicine

## 2020-02-14 DIAGNOSIS — Z720 Tobacco use: Secondary | ICD-10-CM

## 2020-02-14 DIAGNOSIS — R9389 Abnormal findings on diagnostic imaging of other specified body structures: Secondary | ICD-10-CM

## 2021-02-11 ENCOUNTER — Other Ambulatory Visit: Payer: Self-pay | Admitting: Family Medicine

## 2021-02-11 ENCOUNTER — Institutional Professional Consult (permissible substitution): Payer: BC Managed Care – PPO | Admitting: Pulmonary Disease

## 2021-02-11 DIAGNOSIS — Z1231 Encounter for screening mammogram for malignant neoplasm of breast: Secondary | ICD-10-CM

## 2021-03-10 ENCOUNTER — Institutional Professional Consult (permissible substitution): Payer: BC Managed Care – PPO | Admitting: Internal Medicine

## 2021-04-02 ENCOUNTER — Institutional Professional Consult (permissible substitution): Payer: BC Managed Care – PPO | Admitting: Pulmonary Disease

## 2021-04-05 ENCOUNTER — Ambulatory Visit: Payer: BC Managed Care – PPO

## 2021-04-29 ENCOUNTER — Encounter: Payer: Self-pay | Admitting: Pulmonary Disease

## 2021-04-29 ENCOUNTER — Other Ambulatory Visit: Payer: Self-pay

## 2021-04-29 ENCOUNTER — Ambulatory Visit (INDEPENDENT_AMBULATORY_CARE_PROVIDER_SITE_OTHER): Payer: BC Managed Care – PPO | Admitting: Pulmonary Disease

## 2021-04-29 VITALS — BP 120/82 | HR 66 | Temp 98.3°F | Ht 70.0 in | Wt 149.2 lb

## 2021-04-29 DIAGNOSIS — R0602 Shortness of breath: Secondary | ICD-10-CM | POA: Diagnosis not present

## 2021-04-29 DIAGNOSIS — F17200 Nicotine dependence, unspecified, uncomplicated: Secondary | ICD-10-CM

## 2021-04-29 DIAGNOSIS — R0989 Other specified symptoms and signs involving the circulatory and respiratory systems: Secondary | ICD-10-CM

## 2021-04-29 DIAGNOSIS — F17210 Nicotine dependence, cigarettes, uncomplicated: Secondary | ICD-10-CM | POA: Diagnosis not present

## 2021-04-29 DIAGNOSIS — Z716 Tobacco abuse counseling: Secondary | ICD-10-CM

## 2021-04-29 DIAGNOSIS — R0609 Other forms of dyspnea: Secondary | ICD-10-CM | POA: Diagnosis not present

## 2021-04-29 DIAGNOSIS — J449 Chronic obstructive pulmonary disease, unspecified: Secondary | ICD-10-CM

## 2021-04-29 MED ORDER — TRELEGY ELLIPTA 100-62.5-25 MCG/INH IN AEPB: 1.0000 | INHALATION_SPRAY | Freq: Every day | RESPIRATORY_TRACT | 0 refills | Status: DC

## 2021-04-29 MED ORDER — NICOTINE POLACRILEX 4 MG MT GUM: 4.0000 mg | CHEWING_GUM | OROMUCOSAL | 0 refills | Status: DC | PRN

## 2021-04-29 MED ORDER — ALBUTEROL SULFATE HFA 108 (90 BASE) MCG/ACT IN AERS: 2.0000 | INHALATION_SPRAY | Freq: Four times a day (QID) | RESPIRATORY_TRACT | 3 refills | Status: DC | PRN

## 2021-04-29 MED ORDER — TRELEGY ELLIPTA 100-62.5-25 MCG/INH IN AEPB: 1.0000 | INHALATION_SPRAY | Freq: Every day | RESPIRATORY_TRACT | 6 refills | Status: DC

## 2021-04-29 NOTE — Patient Instructions (Addendum)
Thank you for visiting Dr. Valeta Harms at [facility]. Today we recommend the following:  Orders Placed This Encounter  Procedures   Ambulatory Referral for Lung Cancer Scre   Pulmonary Function Test   Trelegy 100 samples, new prescription New rx for albuterol inhaler   Return in about 4 weeks (around 05/27/2021) for with APP or Dr. Valeta Harms. At the same time of PFTs to review results.     Please do your part to reduce the spread of COVID-19.

## 2021-04-29 NOTE — Progress Notes (Signed)
Synopsis: Referred in Oct 2022 for smoker, copd by Caren Macadam, MD  Subjective:   PATIENT ID: Darlene Humphrey GENDER: female DOB: [birth year], MRN: [id number]  Chief Complaint  Patient presents with   Consult    COPD    PMH HTN, smoker, 30 years, max <1 ppd, now smoking 0.5ppd. recently had cxr with concern for emphysema. Not had PFTs before. Was told she may have copd. She has been feeling sob due to climbing stairs.  From a respiratory standpoint she is otherwise able to complete her activities of daily living.  Chest x-ray was completed with her primary care office which showed hyperinflation of the lungs and evidence of emphysema.  She has been trying to quit smoking and she knows that she should.  Currently managed with Breo Ellipta.  She does not have an albuterol inhaler.   Past Medical History:  Diagnosis Date   Hypertension      Family History  Problem Relation Age of Onset   Colon cancer Neg Hx      Past Surgical History:  Procedure Laterality Date   GANGLION CYST EXCISION Right 1995   wrist   RETINAL DETACHMENT SURGERY Left 1985    Social History   Socioeconomic History   Marital status: Single    Spouse name: Not on file   Number of children: Not on file   Years of education: Not on file   Highest education level: Not on file  Occupational History   Not on file  Tobacco Use   Smoking status: Every Day    Packs/day: 0.25    Types: Cigarettes   Smokeless tobacco: Never  Substance and Sexual Activity   Alcohol use: Yes    Alcohol/week: 3.0 standard drinks    Types: 3 Cans of beer per week   Drug use: No   Sexual activity: Not on file  Other Topics Concern   Not on file  Social History Narrative   Not on file   Social Determinants of Health   Financial Resource Strain: Not on file  Food Insecurity: Not on file  Transportation Needs: Not on file  Physical Activity: Not on file  Stress: Not on file  Social Connections: Not on file  Intimate  Partner Violence: Not on file     No Known Allergies   Outpatient Medications Prior to Visit  Medication Sig Dispense Refill   amLODipine (NORVASC) 10 MG tablet TAKE 1 TABLET ORALLY ONCE A DAY 30 DAY(S)     atorvastatin (LIPITOR) 20 MG tablet Take 20 mg by mouth daily.     Multiple Vitamin (MULTIVITAMIN) tablet Take 1 tablet by mouth daily.     furosemide (LASIX) 20 MG tablet Take 20 mg by mouth daily. (Patient not taking: Reported on 04/29/2021)     lisinopril (PRINIVIL,ZESTRIL) 20 MG tablet Take 20 mg by mouth daily. (Patient not taking: Reported on 04/29/2021)     No facility-administered medications prior to visit.    Review of Systems  Constitutional:  Negative for chills, fever, malaise/fatigue and weight loss.  HENT:  Negative for hearing loss, sore throat and tinnitus.   Eyes:  Negative for blurred vision and double vision.  Respiratory:  Positive for cough and shortness of breath. Negative for hemoptysis, sputum production, wheezing and stridor.   Cardiovascular:  Negative for chest pain, palpitations, orthopnea, leg swelling and PND.  Gastrointestinal:  Negative for abdominal pain, constipation, diarrhea, heartburn, nausea and vomiting.  Genitourinary:  Negative for dysuria, hematuria and urgency.  Musculoskeletal:  Negative for joint pain and myalgias.  Skin:  Negative for itching and rash.  Neurological:  Negative for dizziness, tingling, weakness and headaches.  Endo/Heme/Allergies:  Negative for environmental allergies. Does not bruise/bleed easily.  Psychiatric/Behavioral:  Negative for depression. The patient is not nervous/anxious and does not have insomnia.   All other systems reviewed and are negative.   Objective:  Physical Exam Vitals reviewed.  Constitutional:      General: She is not in acute distress.    Appearance: She is well-developed.  HENT:     Head: Normocephalic and atraumatic.  Eyes:     General: No scleral icterus.    Conjunctiva/sclera:  Conjunctivae normal.     Pupils: Pupils are equal, round, and reactive to light.  Neck:     Vascular: No JVD.     Trachea: No tracheal deviation.  Cardiovascular:     Rate and Rhythm: Normal rate and regular rhythm.     Heart sounds: Normal heart sounds. No murmur heard. Pulmonary:     Effort: Pulmonary effort is normal. No tachypnea, accessory muscle usage or respiratory distress.     Breath sounds: No stridor. No wheezing, rhonchi or rales.     Comments: Diminished breath sounds bilaterally Abdominal:     General: Bowel sounds are normal. There is no distension.     Palpations: Abdomen is soft.     Tenderness: There is no abdominal tenderness.  Musculoskeletal:        General: No tenderness.     Cervical back: Neck supple.  Lymphadenopathy:     Cervical: No cervical adenopathy.  Skin:    General: Skin is warm and dry.     Capillary Refill: Capillary refill takes less than 2 seconds.     Findings: No rash.  Neurological:     Mental Status: She is alert and oriented to person, place, and time.  Psychiatric:        Behavior: Behavior normal.     Vitals:   04/29/21 0913  BP: 120/82  Pulse: 66  Temp: 98.3 F (36.8 C)  TempSrc: Oral  SpO2: 95%  Weight: 149 lb 3.2 oz (67.7 kg)  Height: 5\' 10"  (1.778 m)   95% on  RA BMI Readings from Last 3 Encounters:  04/29/21 21.41 kg/m  10/24/13 24.54 kg/m  10/10/13 24.62 kg/m   Wt Readings from Last 3 Encounters:  04/29/21 149 lb 3.2 oz (67.7 kg)  10/24/13 171 lb (77.6 kg)  10/10/13 171 lb 9.6 oz (77.8 kg)     CBC No results found for: WBC, RBC, HGB, HCT, PLT, MCV, MCH, MCHC, RDW, LYMPHSABS, MONOABS, EOSABS, BASOSABS  Chest Imaging: 02/14/2020 chest x-ray: Hyperinflated lung fields, flattening of the diaphragms consistent with COPD changes, emphysema. The patient's images have been independently reviewed by me.    Pulmonary Functions Testing Results: No flowsheet data found.  FeNO:   Pathology:   Echocardiogram:    Heart Catheterization:     Assessment & Plan:     ICD-10-CM   1. Chronic obstructive pulmonary disease, unspecified COPD type ([place])  J44.9 Ambulatory Referral for Lung Cancer Scre    Pulmonary Function Test    2. Hyperinflation of lungs  R09.89 Ambulatory Referral for Lung Cancer Scre    Pulmonary Function Test    3. DOE (dyspnea on exertion)  R06.09     4. SOB (shortness of breath)  R06.02     5. Smoker  F17.200     6. Encounter for smoking  cessation counseling  Z71.6       Discussion: This is a 60 year old female longstanding history of smoking, 30+ years, at her max right at a pack per day has been able to decrease some over the past few months.  Chest x-ray with hyperinflated lungs and evidence of emphysema.  She also has shortness of breath and dyspnea on exertion  Plan: Recommend full pulmonary function test prior to next office visit [place] to Trelegy 100. Give samples today Needs an albuterol inhaler as well. Separate documentation regarding smoking cessation counseling.   Current Outpatient Medications:    amLODipine (NORVASC) 10 MG tablet, TAKE 1 TABLET ORALLY ONCE A DAY 30 DAY(S), Disp: , Rfl:    atorvastatin (LIPITOR) 20 MG tablet, Take 20 mg by mouth daily., Disp: , Rfl:    Multiple Vitamin (MULTIVITAMIN) tablet, Take 1 tablet by mouth daily., Disp: , Rfl:    furosemide (LASIX) 20 MG tablet, Take 20 mg by mouth daily. (Patient not taking: Reported on 04/29/2021), Disp: , Rfl:    lisinopril (PRINIVIL,ZESTRIL) 20 MG tablet, Take 20 mg by mouth daily. (Patient not taking: Reported on 04/29/2021), Disp: , Rfl:    Garner Nash, DO [facility] 04/29/2021 9:20 AM

## 2021-04-29 NOTE — Progress Notes (Signed)
Patient seen in the office today and instructed on use of albuterol.  Patient expressed understanding and demonstrated technique.  

## 2021-04-29 NOTE — Addendum Note (Signed)
Addended by: Vanessa Barbara on: 04/29/2021 10:19 AM   Modules accepted: Orders

## 2021-04-29 NOTE — Progress Notes (Signed)
Smoking Cessation Counseling:   The patient's current tobacco use: 30 years+ down to 0.5ppd The patient was advised to quit and impact of smoking on their health.  I assessed the patient's willingness to attempt to quit. I provided methods and skills for cessation. We reviewed medication management of smoking session drugs if appropriate. Resources to help quit smoking were provided. A smoking cessation quit date was set: Jan 1st  Going to try nicotine gum Follow-up was arranged in our clinic.  The amount of time spent counseling patient was 4 mins    Garner Nash, DO  Pulmonary Critical Care 04/29/2021 9:28 AM

## 2021-06-16 ENCOUNTER — Ambulatory Visit: Payer: BC Managed Care – PPO | Admitting: Pulmonary Disease

## 2021-07-16 ENCOUNTER — Ambulatory Visit: Payer: BC Managed Care – PPO | Admitting: Pulmonary Disease

## 2021-12-14 IMAGING — DX DG CHEST 2V
2 series · 2 of 2 positions shown · non-contrast
Comparison: CT chest 08/02/2013, plain film 07/29/2013

CLINICAL DATA: 59-year-old female with abnormal chest x-ray, with
history of tobacco use

EXAM:
CHEST - 2 VIEW

[dg chest 2 view (1 of 2)]
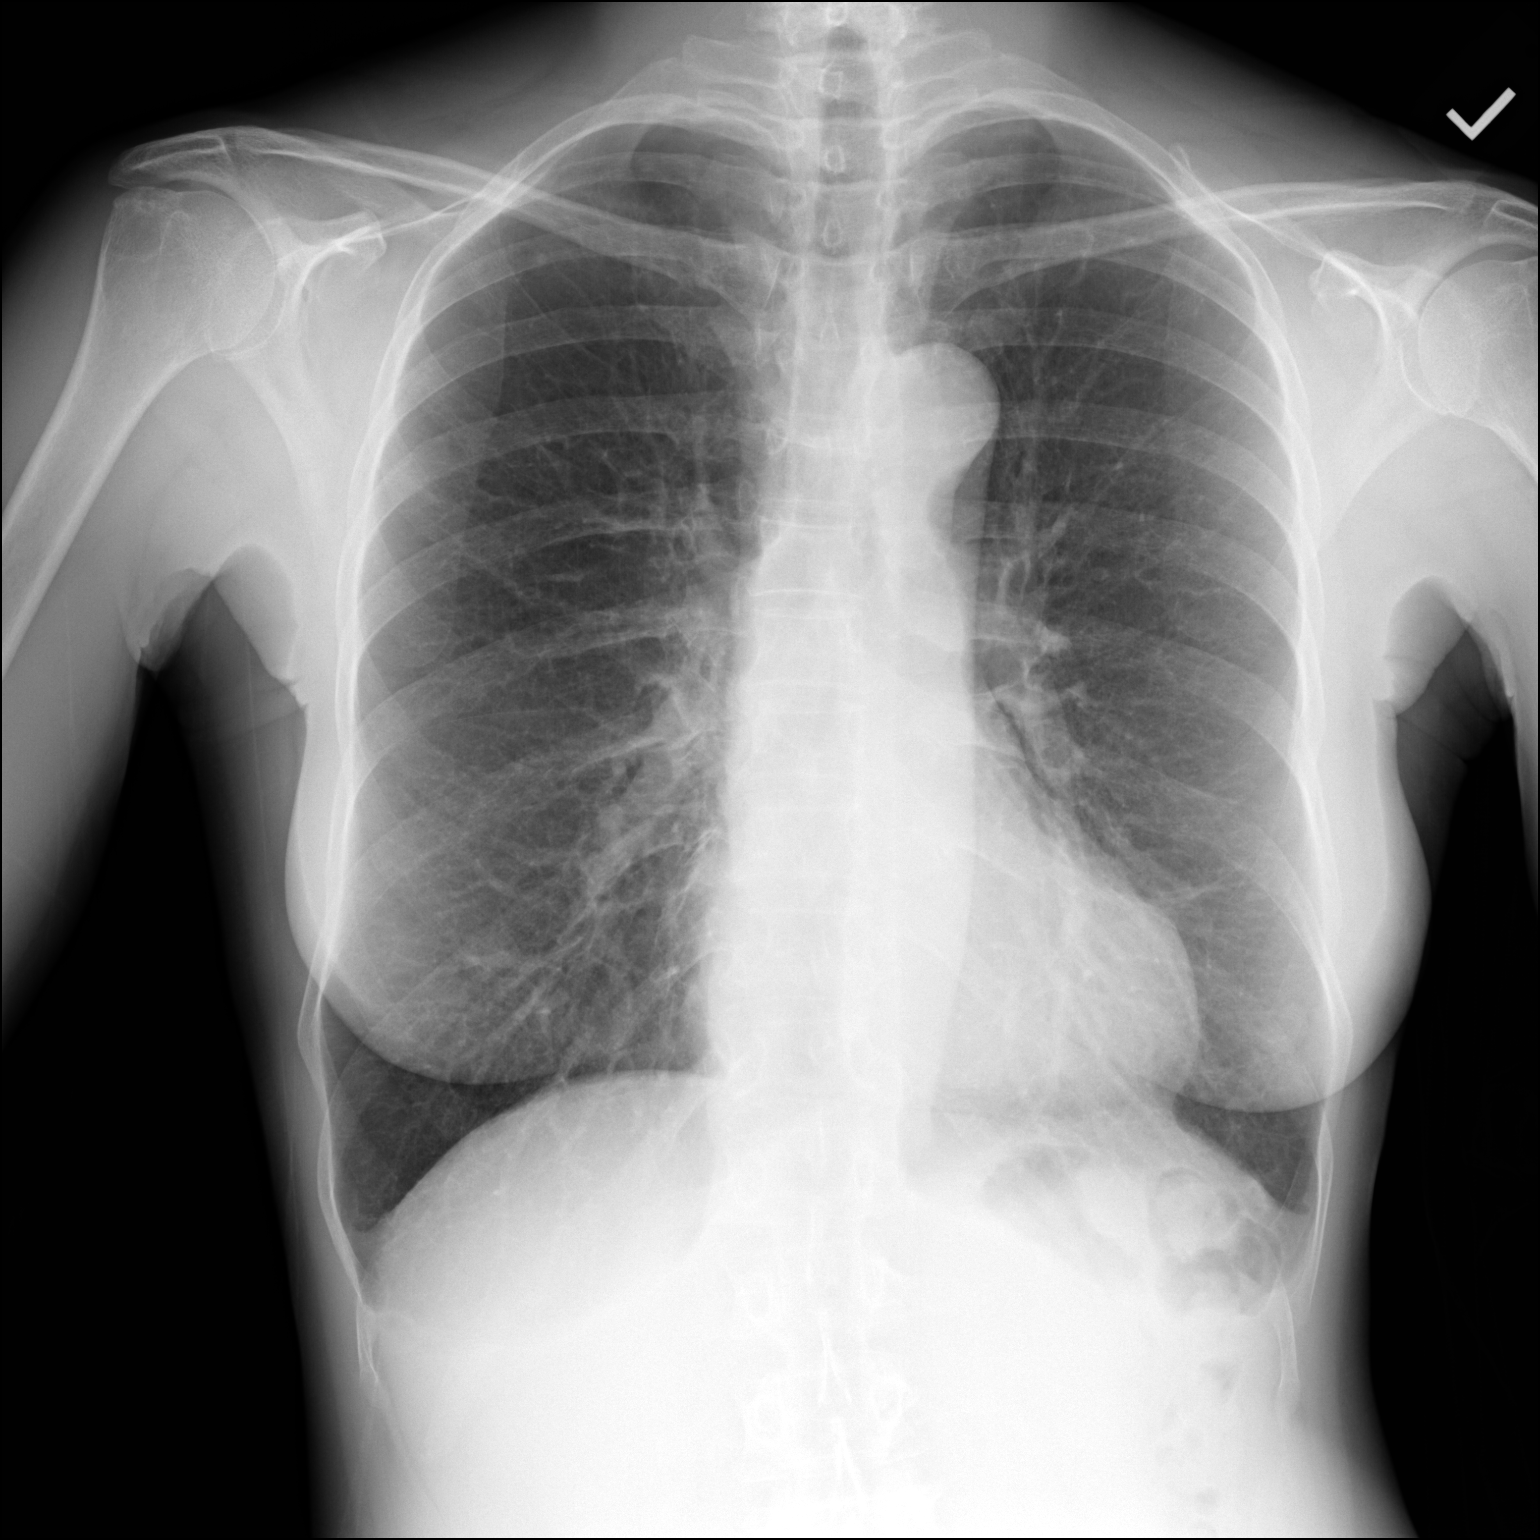

[dg chest 2 view (2 of 2)]
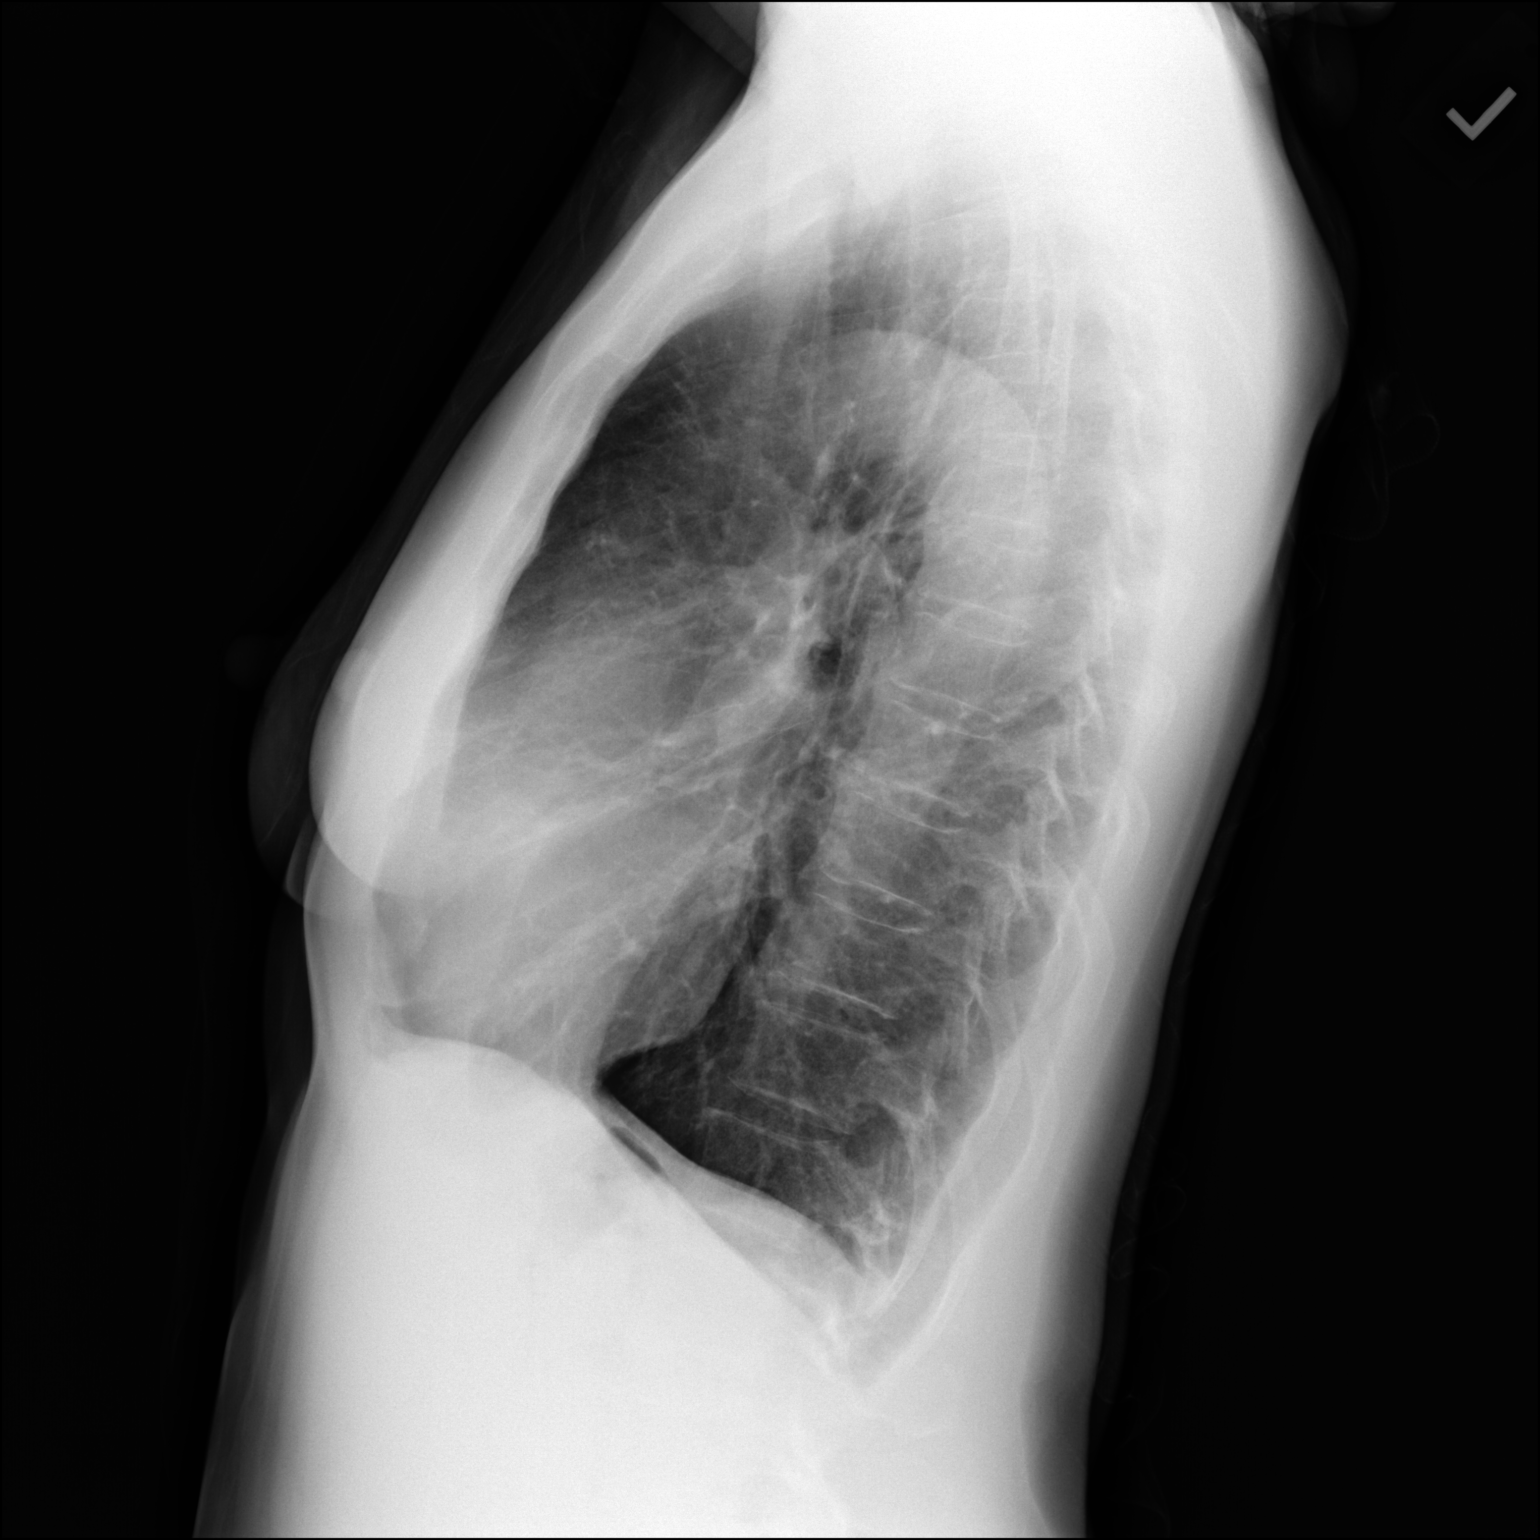

[2 of 2 positions shown; findings below may reference images not displayed]

FINDINGS: Cardiomediastinal silhouette unchanged in size and contour. No
pneumothorax. No pleural effusion. No confluent airspace disease.

Stigmata of emphysema, with increased retrosternal airspace,
flattened hemidiaphragms, increased AP diameter, and hyperinflation
on the AP view.

No displaced fracture.
IMPRESSION: Chronic changes of emphysema without evidence of acute
cardiopulmonary disease

## 2023-08-25 ENCOUNTER — Telehealth: Payer: Self-pay

## 2023-08-25 DIAGNOSIS — F17210 Nicotine dependence, cigarettes, uncomplicated: Secondary | ICD-10-CM

## 2023-08-25 DIAGNOSIS — Z122 Encounter for screening for malignant neoplasm of respiratory organs: Secondary | ICD-10-CM

## 2023-08-25 DIAGNOSIS — Z87891 Personal history of nicotine dependence: Secondary | ICD-10-CM

## 2023-08-25 NOTE — Telephone Encounter (Signed)
.  Lung Cancer Screening Narrative/Criteria Questionnaire (Cigarette Smokers Only- No Cigars/Pipes/vapes)   Darlene Humphrey  1961/03/15   SDMV:08/08/2023 at 10:45am with [place]        LDCT: 09/13/2023 at 1:00pm at GI    63 y.o.   Phone: [phone]  Lung Screening Narrative (confirm age 18-77 yrs Medicare / 50-80 yrs Private pay insurance)   Insurance information:Medicaid    Referring Provider: Rolinda, MD   This screening involves an initial phone call with a team member from our program. It is called a shared decision making visit. The initial meeting is required by  insurance and Medicare to make sure you understand the program. This appointment takes about 15-20 minutes to complete. You will complete the screening scan at your scheduled date/time.  This scan takes about 5-10 minutes to complete. You can eat and drink normally before and after the scan.  Criteria questions for Lung Cancer Screening:   Are you a current or former smoker? Current Age began smoking: 18   If you are a former smoker, what year did you quit smoking? Quit 1 year (within 15 yrs)   To calculate your smoking history, I need an accurate estimate of how many packs of cigarettes you smoked per day and for how many years. (Not just the number of PPD you are now smoking)   Years smoking 43 x Packs per day .75 = Pack years 32.25   (at least 20 pack yrs)   (Make sure they understand that we need to know how much they have smoked in the past, not just the number of PPD they are smoking now)  Do you have a personal history of cancer?  No    Do you have a family history of cancer? Yes  (cancer type and and relative) Mother lung  Are you coughing up blood?  No  Have you had unexplained weight loss of 15 lbs or more in the last 6 months? No  It looks like you meet all criteria.  When would be a good time for us  to schedule you for this screening?   Additional information: N/A

## 2023-09-08 ENCOUNTER — Encounter: Payer: Self-pay | Admitting: Adult Health

## 2023-09-08 ENCOUNTER — Ambulatory Visit: Payer: Medicaid Other | Admitting: Adult Health

## 2023-09-08 DIAGNOSIS — F17210 Nicotine dependence, cigarettes, uncomplicated: Secondary | ICD-10-CM | POA: Diagnosis not present

## 2023-09-08 NOTE — Progress Notes (Signed)
  Virtual Visit via Telephone Note  I connected with GIZEL RIEDLINGER , 09/08/23 10:51 AM by a telemedicine application and verified that I am speaking with the correct person using two identifiers.  Location: Patient: home Provider: home   I discussed the limitations of evaluation and management by telemedicine and the availability of in person appointments. The patient expressed understanding and agreed to proceed.   Shared Decision Making Visit Lung Cancer Screening Program [phone])   Eligibility: 65 y.o. Pack Years Smoking History Calculation = 32 pack  (# packs/per year x # years smoked) Recent History of coughing up blood  no Unexplained weight loss? no ( >Than 15 pounds within the last 6 months ) Prior History Lung / other cancer no (Diagnosis within the last 5 years already requiring surveillance chest CT Scans). Smoking Status Current Smoker   Visit Components: Discussion included one or more decision making aids. YES Discussion included risk/benefits of screening. YES Discussion included potential follow up diagnostic testing for abnormal scans. YES Discussion included meaning and risk of over diagnosis. YES Discussion included meaning and risk of False Positives. YES Discussion included meaning of total radiation exposure. YES  Counseling Included: Importance of adherence to annual lung cancer LDCT screening. YES Impact of comorbidities on ability to participate in the program. YES Ability and willingness to under diagnostic treatment. YES  Smoking Cessation Counseling: Current Smokers:  Discussed importance of smoking cessation. yes Information about tobacco cessation classes and interventions provided to patient. yes Patient provided with "ticket" for LDCT Scan. yes Symptomatic Patient. NO Diagnosis Code: Tobacco Use Z72.0 Asymptomatic Patient yes  Counseling (Intermediate counseling: > three minutes counseling) G9562 (CT Chest Lung Cancer Screening Low Dose  W/O CM) [id number]  Z12.2-Screening of respiratory organs Z87.891-Personal history of nicotine dependence   Danford Bad 09/08/23

## 2023-09-08 NOTE — Patient Instructions (Signed)

## 2023-09-13 ENCOUNTER — Ambulatory Visit
Admission: RE | Admit: 2023-09-13 | Discharge: 2023-09-13 | Disposition: A | Discharge status: Home / Self Care | Payer: Medicaid Other | Source: Ambulatory Visit | Attending: Family Medicine

## 2023-09-13 DIAGNOSIS — Z122 Encounter for screening for malignant neoplasm of respiratory organs: Secondary | ICD-10-CM

## 2023-09-13 DIAGNOSIS — F17210 Nicotine dependence, cigarettes, uncomplicated: Secondary | ICD-10-CM

## 2023-09-13 DIAGNOSIS — Z87891 Personal history of nicotine dependence: Secondary | ICD-10-CM

## 2023-09-25 ENCOUNTER — Institutional Professional Consult (permissible substitution) (INDEPENDENT_AMBULATORY_CARE_PROVIDER_SITE_OTHER): Payer: Self-pay | Admitting: Otolaryngology

## 2023-10-09 ENCOUNTER — Encounter: Payer: Self-pay | Admitting: Gastroenterology

## 2023-10-09 ENCOUNTER — Other Ambulatory Visit: Payer: Self-pay

## 2023-10-09 DIAGNOSIS — F17210 Nicotine dependence, cigarettes, uncomplicated: Secondary | ICD-10-CM

## 2023-10-09 DIAGNOSIS — Z122 Encounter for screening for malignant neoplasm of respiratory organs: Secondary | ICD-10-CM

## 2023-10-09 DIAGNOSIS — Z87891 Personal history of nicotine dependence: Secondary | ICD-10-CM

## 2023-10-31 NOTE — H&P (Signed)
  Patient: Darlene Humphrey  PID: [id number]  DOB: 29-Aug-1960  SEX: Female   Patient referred by DDS for extraction all remaining teeth  CC: NO pain. Bad teeth  Past Medical History:  High Blood Pressure, Smoker, High Cholesterol, COPD    Medications: Amlodipine, Atorvastatin, Escitalopram, Flunisolide Inhaler, ASA    Allergies:     NKDA    Surgeries:   Eye surgery, Oral Surgery     Social History       Smoking:  1/4 ppd          Alcohol:yes Drug use:                             Exam: BMI  20. Multiple caries, retained roots # 6, 7, 8, 10, 11, 12, 13, 14, 15, 21, 22, 23, 24, 25, 26, 27, 28, 29. 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 No purulence, edema, fluctuance, trismus. Oral cancer screening negative. Pharynx clear. No lymphadenopathy.  Panorex:Multiple caries, retained roots # 6, 7, 8, 10, 11, 12, 13, 14, 15, 21, 22, 23, 24, 25, 26, 27, 28, 29.  Assessment: ASA 2. Non-restorable   teeth # 6, 7, 8, 10, 11, 12, 13, 14, 15, 21, 22, 23, 24, 25, 26, 27, 28, 29. Right mandibular buccal exostosis             Plan: Extraction Teeth # teeth # 6, 7, 8, 10, 11, 12, 13, 14, 15, 21, 22, 23, 24, 25, 26, 27, 28, 29. Alveoloplasty   Removal right mandibular buccal exostosis. Hospital Day surgery.                 Rx: n               Risks and complications explained. Questions answered.   Cornelia Dieter, DMD

## 2023-11-01 ENCOUNTER — Other Ambulatory Visit: Payer: Self-pay

## 2023-11-01 ENCOUNTER — Encounter (HOSPITAL_COMMUNITY): Payer: Self-pay | Admitting: Oral Surgery

## 2023-11-01 NOTE — Pre-Procedure Instructions (Signed)
-------------    SDW INSTRUCTIONS given:  Your procedure is scheduled on 4/18.  Report to [facility] Main Entrance "A" at 05:30 A.M., and check in at the Admitting office.  Any questions or running late day of surgery: call [phone]    Remember:  Do not eat or drink after midnight the night before your surgery     Take these medicines the morning of surgery with A SIP OF WATER  Amlodipine Symbicort Lexapro  Follow your surgeon's instructions on when to stop Aspirin.  If no instructions were given by your surgeon then you will need to call the office to get those instructions.    As of today, STOP taking any  Aleve, Naproxen, Ibuprofen, Motrin, Advil, Goody's, BC's, all herbal medications, fish oil, and all vitamins.   Do NOT Smoke (Tobacco/Vaping) 24 hours prior to your procedure  If you use a CPAP at night, you may bring all equipment for your overnight stay.     You will be asked to remove any contacts, glasses, piercing's, hearing aid's, dentures/partials prior to surgery. Please bring cases for these items if needed.     Patients discharged the day of surgery will not be allowed to drive home, and someone needs to stay with them for 24 hours.  SURGICAL WAITING ROOM VISITATION Patients may have no more than 2 support people in the waiting area - these visitors may rotate.   Pre-op nurse will coordinate an appropriate time for 1 ADULT support person, who may not rotate, to accompany patient in pre-op.  Children under the age of 39 must have an adult with them who is not the patient and must remain in the main waiting area with an adult.  If the patient needs to stay at the hospital during part of their recovery, the visitor guidelines for inpatient rooms apply.  Please refer to the [facility] website for the visitor guidelines for any additional information.   Special instructions:   [place]- Preparing For Surgery   Please follow these instructions  carefully.   Shower the NIGHT BEFORE SURGERY and the MORNING OF SURGERY with DIAL Soap.   Pat yourself dry with a CLEAN TOWEL.  Wear CLEAN PAJAMAS to bed the night before surgery  Place CLEAN SHEETS on your bed the night of your first shower and DO NOT SLEEP WITH PETS.   Additional instructions for the day of surgery: DO NOT APPLY any lotions, deodorants, cologne, or perfumes.   Do not wear jewelry or makeup Do not wear nail polish, gel polish, artificial nails, or any other type of covering on natural nails (fingers and toes) Do not bring valuables to the hospital. [facility] is not responsible for valuables/personal belongings. Put on clean/comfortable clothes.  Please brush your teeth.  Ask your nurse before applying any prescription medications to the skin.

## 2023-11-01 NOTE — Progress Notes (Signed)
 PCP - Dr. Dorena Gander Cardiologist - denies  PPM/ICD - denies   Chest x-ray - 02/14/20 EKG - DOS Stress Test - denies ECHO - denies Cardiac Cath - denies  CPAP - denies  DM- denies  Blood Thinner Instructions: n/a Aspirin Instructions: f/u with surgeon  ERAS Protcol - NPO  COVID TEST- n/a  Anesthesia review: no  Patient verbally denies any shortness of breath, fever, cough and chest pain during phone call      Questions were answered. Patient verbalized understanding of instructions.

## 2023-11-02 NOTE — Anesthesia Preprocedure Evaluation (Addendum)
 Anesthesia Evaluation  Patient identified by MRN, date of birth, ID band Patient awake    Reviewed: Allergy & Precautions, NPO status , Patient's Chart, lab work & pertinent test results  History of Anesthesia Complications Negative for: history of anesthetic complications  Airway Mallampati: II  TM Distance: >3 FB Neck ROM: Full    Dental  (+) Dental Advisory Given, Poor Dentition, Loose   Pulmonary neg shortness of breath, COPD (used inhaler this morning),  COPD inhaler, neg recent URI, Current Smoker and Patient abstained from smoking.   Pulmonary exam normal breath sounds clear to auscultation       Cardiovascular hypertension (amlodipine), Pt. on medications (-) angina (-) Past MI, (-) Cardiac Stents and (-) CABG (-) dysrhythmias  Rhythm:Regular Rate:Normal     Neuro/Psych  PSYCHIATRIC DISORDERS Anxiety     negative neurological ROS     GI/Hepatic negative GI ROS, Neg liver ROS,,,  Endo/Other  negative endocrine ROS    Renal/GU negative Renal ROS     Musculoskeletal   Abdominal   Peds  Hematology negative hematology ROS (+) Lab Results      Component                Value               Date                      WBC                      8.4                 11/03/2023                HGB                      14.4                11/03/2023                HCT                      45.7                11/03/2023                MCV                      84.6                11/03/2023                PLT                      264                 11/03/2023              Anesthesia Other Findings   Reproductive/Obstetrics                             Anesthesia Physical Anesthesia Plan  ASA: 2  Anesthesia Plan: General   Post-op Pain Management: Tylenol  PO (pre-op)*   Induction: Intravenous  PONV Risk Score and Plan: 2 and Ondansetron , Dexamethasone  and Treatment may vary due to age or  medical condition  Airway Management Planned: Nasal  ETT  Additional Equipment:   Intra-op Plan:   Post-operative Plan: Extubation in OR  Informed Consent: I have reviewed the patients History and Physical, chart, labs and discussed the procedure including the risks, benefits and alternatives for the proposed anesthesia with the patient or authorized representative who has indicated his/her understanding and acceptance.     Dental advisory given  Plan Discussed with: CRNA and Anesthesiologist  Anesthesia Plan Comments: (Risks of general anesthesia discussed including, but not limited to, sore throat, hoarse voice, chipped/damaged teeth, injury to vocal cords, nausea and vomiting, allergic reactions, lung infection, heart attack, stroke, and death. All questions answered. )        Anesthesia Quick Evaluation

## 2023-11-03 ENCOUNTER — Ambulatory Visit (HOSPITAL_BASED_OUTPATIENT_CLINIC_OR_DEPARTMENT_OTHER): Admitting: Anesthesiology

## 2023-11-03 ENCOUNTER — Encounter (HOSPITAL_COMMUNITY)
Admission: RE | Disposition: A | Discharge status: Home / Self Care | Payer: Self-pay | Source: Home / Self Care | Attending: Oral Surgery

## 2023-11-03 ENCOUNTER — Ambulatory Visit (HOSPITAL_COMMUNITY): Admitting: Anesthesiology

## 2023-11-03 ENCOUNTER — Ambulatory Visit (HOSPITAL_COMMUNITY)
Admission: RE | Admit: 2023-11-03 | Discharge: 2023-11-03 | Disposition: A | Discharge status: Home / Self Care | Attending: Oral Surgery | Admitting: Oral Surgery

## 2023-11-03 ENCOUNTER — Other Ambulatory Visit: Payer: Self-pay

## 2023-11-03 DIAGNOSIS — K083 Retained dental root: Secondary | ICD-10-CM | POA: Diagnosis not present

## 2023-11-03 DIAGNOSIS — F17210 Nicotine dependence, cigarettes, uncomplicated: Secondary | ICD-10-CM | POA: Diagnosis not present

## 2023-11-03 DIAGNOSIS — M278 Other specified diseases of jaws: Secondary | ICD-10-CM | POA: Diagnosis not present

## 2023-11-03 DIAGNOSIS — M2740 Unspecified cyst of jaw: Secondary | ICD-10-CM | POA: Diagnosis not present

## 2023-11-03 DIAGNOSIS — I10 Essential (primary) hypertension: Secondary | ICD-10-CM | POA: Insufficient documentation

## 2023-11-03 DIAGNOSIS — K029 Dental caries, unspecified: Secondary | ICD-10-CM | POA: Diagnosis present

## 2023-11-03 DIAGNOSIS — J449 Chronic obstructive pulmonary disease, unspecified: Secondary | ICD-10-CM | POA: Diagnosis not present

## 2023-11-03 DIAGNOSIS — Z79899 Other long term (current) drug therapy: Secondary | ICD-10-CM | POA: Insufficient documentation

## 2023-11-03 DIAGNOSIS — F419 Anxiety disorder, unspecified: Secondary | ICD-10-CM | POA: Diagnosis not present

## 2023-11-03 HISTORY — PX: TOOTH EXTRACTION: SHX859

## 2023-11-03 HISTORY — DX: Chronic obstructive pulmonary disease, unspecified: J44.9

## 2023-11-03 HISTORY — DX: Anxiety disorder, unspecified: F41.9

## 2023-11-03 LAB — BASIC METABOLIC PANEL WITH GFR
Anion gap: 12 (ref 5–15)
BUN: 18 mg/dL (ref 8–23)
CO2: 19 mmol/L — ABNORMAL LOW (ref 22–32)
Calcium: 9.3 mg/dL (ref 8.9–10.3)
Chloride: 108 mmol/L (ref 98–111)
Creatinine, Ser: 0.96 mg/dL (ref 0.44–1.00)
GFR, Estimated: >60 mL/min (ref >60)
Glucose, Bld: 96 mg/dL (ref 70–99)
Potassium: 3 mmol/L — ABNORMAL LOW (ref 3.5–5.1)
Sodium: 139 mmol/L (ref 135–145)

## 2023-11-03 LAB — CBC
HCT: 45.7 % (ref 36.0–46.0)
Hemoglobin: 14.4 g/dL (ref 12.0–15.0)
MCH: 26.7 pg (ref 26.0–34.0)
MCHC: 31.5 g/dL (ref 30.0–36.0)
MCV: 84.6 fL (ref 80.0–100.0)
Platelets: 264 10*3/uL (ref 150–400)
RBC: 5.4 MIL/uL — ABNORMAL HIGH (ref 3.87–5.11)
RDW: 15.8 % — ABNORMAL HIGH (ref 11.5–15.5)
WBC: 8.4 10*3/uL (ref 4.0–10.5)
nRBC: 0 % (ref 0.0–0.2)

## 2023-11-03 SURGERY — DENTAL RESTORATION/EXTRACTIONS
Anesthesia: General | Site: Mouth

## 2023-11-03 MED ORDER — 0.9 % SODIUM CHLORIDE (POUR BTL) OPTIME
TOPICAL | Status: DC | PRN
  Administered 2023-11-03: 1000 mL

## 2023-11-03 MED ORDER — LIDOCAINE-EPINEPHRINE 2 %-1:100000 IJ SOLN
INTRAMUSCULAR | Status: AC
Start: 2023-11-03 — End: ?
  Filled 2023-11-03: qty 1

## 2023-11-03 MED ORDER — OXYMETAZOLINE HCL 0.05 % NA SOLN
NASAL | Status: AC
  Filled 2023-11-03: qty 30

## 2023-11-03 MED ORDER — PROPOFOL 10 MG/ML IV BOLUS
INTRAVENOUS | Status: DC | PRN
  Administered 2023-11-03: 50 mg via INTRAVENOUS
  Administered 2023-11-03: 150 mg via INTRAVENOUS

## 2023-11-03 MED ORDER — FENTANYL CITRATE (PF) 250 MCG/5ML IJ SOLN
INTRAMUSCULAR | Status: DC | PRN
  Administered 2023-11-03: 100 ug via INTRAVENOUS
  Administered 2023-11-03: 50 ug via INTRAVENOUS

## 2023-11-03 MED ORDER — LIDOCAINE-EPINEPHRINE 2 %-1:100000 IJ SOLN
INTRAMUSCULAR | Status: DC | PRN
  Administered 2023-11-03: 20 mL

## 2023-11-03 MED ORDER — ONDANSETRON HCL 4 MG/2ML IJ SOLN
INTRAMUSCULAR | Status: DC | PRN
Start: 2023-11-03 — End: 2023-11-03
  Administered 2023-11-03: 4 mg via INTRAVENOUS

## 2023-11-03 MED ORDER — ONDANSETRON HCL 4 MG PO TABS: 4.0000 mg | ORAL_TABLET | Freq: Three times a day (TID) | ORAL | 1 refills | Status: AC | PRN

## 2023-11-03 MED ORDER — ROCURONIUM BROMIDE 10 MG/ML (PF) SYRINGE
PREFILLED_SYRINGE | INTRAVENOUS | Status: DC | PRN
Start: 2023-11-03 — End: 2023-11-03
  Administered 2023-11-03: 50 mg via INTRAVENOUS

## 2023-11-03 MED ORDER — CEFAZOLIN SODIUM-DEXTROSE 2-4 GM/100ML-% IV SOLN
2.0000 g | INTRAVENOUS | Status: AC
  Administered 2023-11-03: 2 g via INTRAVENOUS
  Filled 2023-11-03: qty 100

## 2023-11-03 MED ORDER — MIDAZOLAM HCL 2 MG/2ML IJ SOLN
INTRAMUSCULAR | Status: AC
  Filled 2023-11-03: qty 2

## 2023-11-03 MED ORDER — PHENYLEPHRINE 80 MCG/ML (10ML) SYRINGE FOR IV PUSH (FOR BLOOD PRESSURE SUPPORT)
PREFILLED_SYRINGE | INTRAVENOUS | Status: DC | PRN
  Administered 2023-11-03 (×2): 80 ug via INTRAVENOUS
  Administered 2023-11-03: 160 ug via INTRAVENOUS
  Administered 2023-11-03 (×2): 80 ug via INTRAVENOUS

## 2023-11-03 MED ORDER — ORAL CARE MOUTH RINSE: 15.0000 mL | Freq: Once | OROMUCOSAL | Status: AC

## 2023-11-03 MED ORDER — DEXAMETHASONE SODIUM PHOSPHATE 10 MG/ML IJ SOLN
INTRAMUSCULAR | Status: DC | PRN
  Administered 2023-11-03: 10 mg via INTRAVENOUS

## 2023-11-03 MED ORDER — MIDAZOLAM HCL 2 MG/2ML IJ SOLN
INTRAMUSCULAR | Status: DC | PRN
Start: 2023-11-03 — End: 2023-11-03
  Administered 2023-11-03: 2 mg via INTRAVENOUS

## 2023-11-03 MED ORDER — PROPOFOL 10 MG/ML IV BOLUS
INTRAVENOUS | Status: AC
  Filled 2023-11-03: qty 20

## 2023-11-03 MED ORDER — SUGAMMADEX SODIUM 200 MG/2ML IV SOLN
INTRAVENOUS | Status: DC | PRN
  Administered 2023-11-03: 250 mg via INTRAVENOUS

## 2023-11-03 MED ORDER — CHLORHEXIDINE GLUCONATE 0.12 % MT SOLN
15.0000 mL | Freq: Once | OROMUCOSAL | Status: AC
  Administered 2023-11-03: 15 mL via OROMUCOSAL
  Filled 2023-11-03: qty 15

## 2023-11-03 MED ORDER — GLYCOPYRROLATE 0.2 MG/ML IJ SOLN
INTRAMUSCULAR | Status: DC | PRN
  Administered 2023-11-03: .2 mg via INTRAVENOUS

## 2023-11-03 MED ORDER — LACTATED RINGERS IV SOLN: INTRAVENOUS | Status: DC

## 2023-11-03 MED ORDER — OXYMETAZOLINE HCL 0.05 % NA SOLN
NASAL | Status: DC | PRN
  Administered 2023-11-03: 2 via NASAL

## 2023-11-03 MED ORDER — GLYCOPYRROLATE PF 0.2 MG/ML IJ SOSY
PREFILLED_SYRINGE | INTRAMUSCULAR | Status: AC
  Filled 2023-11-03: qty 1

## 2023-11-03 MED ORDER — PHENYLEPHRINE 80 MCG/ML (10ML) SYRINGE FOR IV PUSH (FOR BLOOD PRESSURE SUPPORT)
PREFILLED_SYRINGE | INTRAVENOUS | Status: AC
  Filled 2023-11-03: qty 10

## 2023-11-03 MED ORDER — SODIUM CHLORIDE 0.9 % IR SOLN
Status: DC | PRN
  Administered 2023-11-03: 1000 mL

## 2023-11-03 MED ORDER — EPHEDRINE SULFATE-NACL 50-0.9 MG/10ML-% IV SOSY
PREFILLED_SYRINGE | INTRAVENOUS | Status: DC | PRN
  Administered 2023-11-03: 5 mg via INTRAVENOUS
  Administered 2023-11-03: 10 mg via INTRAVENOUS

## 2023-11-03 MED ORDER — OXYCODONE HCL 5 MG PO TABS: 5.0000 mg | ORAL_TABLET | Freq: Four times a day (QID) | ORAL | 0 refills | Status: AC | PRN

## 2023-11-03 MED ORDER — ACETAMINOPHEN 500 MG PO TABS
1000.0000 mg | ORAL_TABLET | Freq: Once | ORAL | Status: AC
  Administered 2023-11-03: 1000 mg via ORAL
  Filled 2023-11-03: qty 2

## 2023-11-03 MED ORDER — FENTANYL CITRATE (PF) 250 MCG/5ML IJ SOLN
INTRAMUSCULAR | Status: AC
  Filled 2023-11-03: qty 5

## 2023-11-03 MED ORDER — AMOXICILLIN 500 MG PO CAPS: 500.0000 mg | ORAL_CAPSULE | Freq: Three times a day (TID) | ORAL | 0 refills | Status: AC

## 2023-11-03 MED ORDER — EPHEDRINE 5 MG/ML INJ
INTRAVENOUS | Status: AC
  Filled 2023-11-03: qty 5

## 2023-11-03 MED ORDER — LIDOCAINE 2% (20 MG/ML) 5 ML SYRINGE
INTRAMUSCULAR | Status: DC | PRN
Start: 2023-11-03 — End: 2023-11-03
  Administered 2023-11-03: 80 mg via INTRAVENOUS

## 2023-11-03 SURGICAL SUPPLY — 26 items
BAG COUNTER SPONGE SURGICOUNT (BAG) IMPLANT
BLADE SURG 15 STRL LF DISP TIS (BLADE) ×1 IMPLANT
BUR CROSS CUT FISSURE 1.6 (BURR) ×1 IMPLANT
BUR EGG ELITE 4.0 (BURR) IMPLANT
CANISTER SUCT 3000ML PPV (MISCELLANEOUS) ×1 IMPLANT
COVER SURGICAL LIGHT HANDLE (MISCELLANEOUS) ×1 IMPLANT
GAUZE PACKING FOLDED 2 STR (GAUZE/BANDAGES/DRESSINGS) ×1 IMPLANT
GLOVE BIO SURGEON STRL SZ8 (GLOVE) ×1 IMPLANT
GOWN STRL REUS W/ TWL LRG LVL3 (GOWN DISPOSABLE) ×1 IMPLANT
GOWN STRL REUS W/ TWL XL LVL3 (GOWN DISPOSABLE) ×1 IMPLANT
IV NS 1000ML BAXH (IV SOLUTION) ×1 IMPLANT
KIT BASIN OR (CUSTOM PROCEDURE TRAY) ×1 IMPLANT
KIT TURNOVER KIT B (KITS) ×1 IMPLANT
NDL HYPO 25GX1X1/2 BEV (NEEDLE) ×2 IMPLANT
NEEDLE HYPO 25GX1X1/2 BEV (NEEDLE) ×2 IMPLANT
NS IRRIG 1000ML POUR BTL (IV SOLUTION) ×1 IMPLANT
PAD ARMBOARD POSITIONER FOAM (MISCELLANEOUS) ×1 IMPLANT
SLEEVE IRRIGATION ELITE 7 (MISCELLANEOUS) ×1 IMPLANT
SPIKE FLUID TRANSFER (MISCELLANEOUS) IMPLANT
SUT CHROMIC 3 0 SH 27 (SUTURE) IMPLANT
SUT PLAIN 3 0 PS2 27 (SUTURE) IMPLANT
SYR BULB IRRIG 60ML STRL (SYRINGE) ×1 IMPLANT
SYR CONTROL 10ML LL (SYRINGE) ×1 IMPLANT
TRAY ENT MC OR (CUSTOM PROCEDURE TRAY) ×1 IMPLANT
TUBING IRRIGATION (MISCELLANEOUS) ×1 IMPLANT
YANKAUER SUCT BULB TIP NO VENT (SUCTIONS) ×1 IMPLANT

## 2023-11-03 NOTE — Anesthesia Postprocedure Evaluation (Signed)
 Anesthesia Post Note  Patient: Mahogany Torrance Shimer  Procedure(s) Performed: EXTRACTION TEETH SIX, SEVEN, EIGHT, TEN, ELEVEN, TWELVE, THIRTEEN, FOURTEEN, FIFTEEN, TWENTY-ONE, TWENTY-TWO, TWENTY-THREE, TWENTY-FOUR, TWENTY-FIVE, TWENTY-SIX, TWENTY-SEVEN, TWENTY-EIGHT, AND TWENTY-NINE; ALVEOLOPLASTY; REMOVAL RIGHT MANDIBULAR BUCCAL EXOSTOSIS (Mouth)     Patient location during evaluation: PACU Anesthesia Type: General Level of consciousness: awake Pain management: pain level controlled Vital Signs Assessment: post-procedure vital signs reviewed and stable Respiratory status: spontaneous breathing, nonlabored ventilation and respiratory function stable Cardiovascular status: blood pressure returned to baseline and stable Postop Assessment: no apparent nausea or vomiting Anesthetic complications: no   No notable events documented.  Last Vitals:  Vitals:   11/03/23 0915 11/03/23 0930  BP: 124/79 127/79  Pulse: (!) 59 63  Resp: 13 18  Temp:  36.7 C  SpO2: 92% 94%    Last Pain:  Vitals:   11/03/23 0930  TempSrc:   PainSc: 0-No pain                 Conard Decent

## 2023-11-03 NOTE — Op Note (Signed)
 11/03/2023  8:42 AM  PATIENT:  Lucendia Rusk Muhlestein  63 y.o. female  PRE-OPERATIVE DIAGNOSIS:  NON-RESTORABLE TEETH SIX, SEVEN, EIGHT, TEN, ELEVEN, TWELVE, THIRTEEN, FOURTEEN, FIFTEEN, TWENTY-ONE, TWENTY-TWO, TWENTY-THREE, TWENTY-FOUR, TWENTY-FIVE, TWENTY-SIX, TWENTY-SEVEN, TWENTY-EIGHT, AND TWENTY-NINE, SECONDARY TO DENTAL CARIES. RIGHT MANDIBULAR BUCCAL EXOSTOSIS  POST-OPERATIVE DIAGNOSIS: NON-RESTORABLE TEETH SIX, SEVEN, EIGHT, TEN, ELEVEN, TWELVE, THIRTEEN, FOURTEEN, FIFTEEN, TWENTY-ONE, TWENTY-TWO, TWENTY-THREE, TWENTY-FOUR, TWENTY-FIVE, TWENTY-SIX, TWENTY-SEVEN, TWENTY-EIGHT, AND TWENTY-NINE, SECONDARY TO DENTAL CARIES. RIGHT MANDIBULAR CYST.   PROCEDURE:  Procedure(s): EXTRACTION TEETH SIX, SEVEN, EIGHT, TEN, ELEVEN, TWELVE, THIRTEEN, FOURTEEN, FIFTEEN, TWENTY-ONE, TWENTY-TWO, TWENTY-THREE, TWENTY-FOUR, TWENTY-FIVE, TWENTY-SIX, TWENTY-SEVEN, TWENTY-EIGHT, AND TWENTY-NINE; ALVEOLOPLASTY; REMOVAL RIGHT MANDIBULAR CYST.   SURGEON:  Surgeon(s): Ascencion Lava, DMD  ANESTHESIA:   local and general  EBL:  minimal  DRAINS: none   SPECIMEN: RIGHT MANDIBULAR CYST.   COUNTS:  YES  PLAN OF CARE: Discharge to home after PACU  PATIENT DISPOSITION:  PACU - hemodynamically stable.   PROCEDURE DETAILS: Dictation #[id number]  Cornelia Dieter, DMD 11/03/2023 8:42 AM

## 2023-11-03 NOTE — Op Note (Signed)
 Darlene Humphrey, Darlene Humphrey. MEDICAL RECORD NO: [id number] ACCOUNT NO: [id number] DATE OF BIRTH: Nov 07, 1960 FACILITY: [facility] LOCATION: [facility] PHYSICIAN: Glendia EMERSON Primrose, DDS  Operative Report   DATE OF PROCEDURE: 11/03/2023  PREOPERATIVE DIAGNOSIS:  Nonrestorable teeth numbers 6, 7, 8, 10, 11, 12, 13, 14, 15, 21, 22, 23, 24, 25, 26, 27, 28, 29, buccal exostosis, right mandible.  POSTOPERATIVE DIAGNOSIS: Nonrestorable teeth numbers 6, 7, 8, 10, 11, 12, 13, 14, 15, 21, 22, 23, 24, 25, 26, 27, 28, 29; cyst, right mandible.  PROCEDURE:  Extraction of teeth per above; alveoloplasty; removal of right mandibular cyst.  SURGEON:  Glendia EMERSON Primrose, DDS  ANESTHESIA:  General, nasal intubation.  Dr. Peggye attending.  PROCEDURE:  The patient was taken to the operating room and placed on the table in supine position. General anesthesia was administered and nasal endotracheal tube was placed and secured.  The eyes were protected.  The patient was draped for surgery.  A  timeout was performed.  The posterior pharynx was suctioned and a throat pack was placed.  2% lidocaine  with 1:100,000 epinephrine  was infiltrated in an inferior alveolar block on the right and left sides with buccal infiltration in the anterior mandible  and buccal and palatal infiltration in the maxilla around the teeth to be removed.  A bite block was placed on the right side of the mouth.  The left side was operated first.  A 15 blade was used to make an incision 1 cm proximal to tooth #21 on the  alveolar crest carried forward around teeth numbers 22, 23, 24, 25, and 26 in the buccal and lingual sulcus.  The periosteum was reflected from around these teeth.  The teeth were elevated and removed with the dental forceps.  The sockets were curetted.   The periosteum was reflected to expose the alveolar crest, which is irregular in contour.  Alveoloplasty was performed using the egg burr followed by the bone file.  Then, the area was irrigated and  closed with 3-0 chromic.  Then, the left maxilla was  operated.  A 15 blade was used to make an incision around teeth numbers 15, 14, 13, 12, 11, 10, 7, and 8.  The periosteum was reflected.  Teeth numbers 15, 14, 13, 10, 8, 7 were removed with the dental forceps after elevating with a 301 elevator.  Teeth  numbers 11, 12, and 6 required removal of circumferential bone around the crowns and roots of the teeth in order to get these teeth elevated and removed with the 301 elevator and dental forceps.  The sockets were curetted, debrided, and alveoloplasty was  performed using the egg burr and bone file and then the area was irrigated and closed with 3-0 chromic.  Attention was then turned to the right mandible.  A 15 blade was used to make an incision around teeth numbers 27, 28, and 29.  The periosteum was  reflected and reflection was carried inferiorly over the bone that was soft and had liquid material within it.  The lesion appeared as an exostosis on her initial exam, but surgery was found to be a cystic lesion within the bone.  Then, the curettes and  periosteal elevator were used to reflect the cyst from the bone.  The cyst measured approximately 2 cm x 1 cm.  The inferior alveolar nerve or mental nerve was not within the cyst, nor was it seen inside the bony crypt.  Then, teeth numbers 27, 28, and  29 were removed.  A 28 and 29 were removed with dental forceps.  A 27 required sectioning and removal of interproximal bone in order to remove the tooth and still relieve as much bone as possible for mandibular contour.  Once the cyst was removed, the  bone margins were trimmed and smoothed with the egg burr and then the right maxilla and mandible were closed with 3-0 chromic after irrigating.  Then, the oral cavity was irrigated and suctioned.  The throat pack was removed.  The patient was left in  care of anesthesia for extubation and transported to recovery with plans for discharge home through day  surgery.  ESTIMATED BLOOD LOSS:  Minimum.  DRAINS:  None.  SPECIMEN:  Right mandibular cyst.  PRELIMINARY DIAGNOSIS:  Radicular cyst.  Counts were correct.   PUS D: 11/03/2023 8:51:46 am T: 11/03/2023 5:04:00 pm  JOB: [id number]/ [id number]

## 2023-11-03 NOTE — Transfer of Care (Signed)
 Immediate Anesthesia Transfer of Care Note  Patient: Tanyla Stege Keyworth  Procedure(s) Performed: EXTRACTION TEETH SIX, SEVEN, EIGHT, TEN, ELEVEN, TWELVE, THIRTEEN, FOURTEEN, FIFTEEN, TWENTY-ONE, TWENTY-TWO, TWENTY-THREE, TWENTY-FOUR, TWENTY-FIVE, TWENTY-SIX, TWENTY-SEVEN, TWENTY-EIGHT, AND TWENTY-NINE; ALVEOLOPLASTY; REMOVAL RIGHT MANDIBULAR BUCCAL EXOSTOSIS (Mouth)  Patient Location: PACU  Anesthesia Type:General  Level of Consciousness: awake and patient cooperative  Airway & Oxygen Therapy: Patient Spontanous Breathing and Patient connected to face mask oxygen  Post-op Assessment: Report given to RN and Post -op Vital signs reviewed and stable  Post vital signs: Reviewed and stable  Last Vitals:  Vitals Value Taken Time  BP 144/85 11/03/23 0900  Temp 36.7 C 11/03/23 0900  Pulse 63 11/03/23 0901  Resp 23 11/03/23 0901  SpO2 98 % 11/03/23 0901  Vitals shown include unfiled device data.  Last Pain:  Vitals:   11/03/23 0900  TempSrc:   PainSc: 0-No pain         Complications: No notable events documented.

## 2023-11-03 NOTE — Anesthesia Procedure Notes (Signed)
 Procedure Name: Intubation Date/Time: 11/03/2023 7:36 AM  Performed by: Luwanna Sam, CRNAPre-anesthesia Checklist: Patient identified, Emergency Drugs available, Suction available, Patient being monitored and Timeout performed Patient Re-evaluated:Patient Re-evaluated prior to induction Oxygen Delivery Method: Circle system utilized Preoxygenation: Pre-oxygenation with 100% oxygen Induction Type: IV induction Ventilation: Mask ventilation without difficulty Laryngoscope Size: Glidescope and 3 Grade View: Grade I Nasal Tubes: Right, Nasal prep performed, Magill forceps - small, utilized and Nasal Rae Tube size: 7.0 mm Number of attempts: 2 Placement Confirmation: ETT inserted through vocal cords under direct vision, positive ETCO2 and breath sounds checked- equal and bilateral Secured at: 26 cm Tube secured with: Tape Dental Injury: Teeth and Oropharynx as per pre-operative assessment

## 2023-11-03 NOTE — H&P (Signed)
 H&P documentation  -History and Physical Reviewed  -Patient has been re-examined  -No change in the plan of care  Darlene Humphrey

## 2023-11-04 ENCOUNTER — Encounter (HOSPITAL_COMMUNITY): Payer: Self-pay | Admitting: Oral Surgery

## 2023-11-06 ENCOUNTER — Telehealth: Payer: Self-pay | Admitting: *Deleted

## 2023-11-06 ENCOUNTER — Encounter

## 2023-11-06 LAB — SURGICAL PATHOLOGY

## 2023-11-06 NOTE — Telephone Encounter (Signed)
 Attempt to reach pt for pre-visit. LM with call back #.  Will attempt to reach again in 5 min due to no other # listed in profile  Second attempt to reach pt for pre-vist unsuccessful. LM with facility # for pt to call back. Instructed pt to call # given by end of the day and reschedule the pre-visit  with RN or the scheduled procedure will be canceled.

## 2023-11-08 ENCOUNTER — Ambulatory Visit (AMBULATORY_SURGERY_CENTER)

## 2023-11-08 VITALS — Ht 70.0 in | Wt 140.0 lb

## 2023-11-08 DIAGNOSIS — Z860100 Personal history of colon polyps, unspecified: Secondary | ICD-10-CM

## 2023-11-08 MED ORDER — NA SULFATE-K SULFATE-MG SULF 17.5-3.13-1.6 GM/177ML PO SOLN: 1.0000 | Freq: Once | ORAL | 0 refills | Status: AC

## 2023-11-08 NOTE — Progress Notes (Signed)

## 2023-11-16 ENCOUNTER — Encounter: Payer: Self-pay | Admitting: Gastroenterology

## 2023-11-21 ENCOUNTER — Telehealth (INDEPENDENT_AMBULATORY_CARE_PROVIDER_SITE_OTHER): Payer: Self-pay | Admitting: Otolaryngology

## 2023-11-21 ENCOUNTER — Ambulatory Visit: Admitting: Gastroenterology

## 2023-11-21 ENCOUNTER — Encounter: Payer: Self-pay | Admitting: Gastroenterology

## 2023-11-21 VITALS — BP 116/75 | HR 52 | Temp 98.2°F | Resp 12 | Ht 70.0 in | Wt 140.0 lb

## 2023-11-21 DIAGNOSIS — D120 Benign neoplasm of cecum: Secondary | ICD-10-CM | POA: Diagnosis not present

## 2023-11-21 DIAGNOSIS — D125 Benign neoplasm of sigmoid colon: Secondary | ICD-10-CM | POA: Diagnosis not present

## 2023-11-21 DIAGNOSIS — D123 Benign neoplasm of transverse colon: Secondary | ICD-10-CM | POA: Diagnosis not present

## 2023-11-21 DIAGNOSIS — D122 Benign neoplasm of ascending colon: Secondary | ICD-10-CM | POA: Diagnosis not present

## 2023-11-21 DIAGNOSIS — K644 Residual hemorrhoidal skin tags: Secondary | ICD-10-CM | POA: Diagnosis not present

## 2023-11-21 DIAGNOSIS — Z860101 Personal history of adenomatous and serrated colon polyps: Secondary | ICD-10-CM | POA: Diagnosis not present

## 2023-11-21 DIAGNOSIS — D128 Benign neoplasm of rectum: Secondary | ICD-10-CM | POA: Diagnosis not present

## 2023-11-21 DIAGNOSIS — Z860100 Personal history of colon polyps, unspecified: Secondary | ICD-10-CM

## 2023-11-21 DIAGNOSIS — K621 Rectal polyp: Secondary | ICD-10-CM | POA: Diagnosis not present

## 2023-11-21 DIAGNOSIS — K635 Polyp of colon: Secondary | ICD-10-CM | POA: Diagnosis not present

## 2023-11-21 DIAGNOSIS — F419 Anxiety disorder, unspecified: Secondary | ICD-10-CM | POA: Diagnosis not present

## 2023-11-21 DIAGNOSIS — K648 Other hemorrhoids: Secondary | ICD-10-CM

## 2023-11-21 DIAGNOSIS — Z1211 Encounter for screening for malignant neoplasm of colon: Secondary | ICD-10-CM | POA: Diagnosis not present

## 2023-11-21 DIAGNOSIS — K5730 Diverticulosis of large intestine without perforation or abscess without bleeding: Secondary | ICD-10-CM | POA: Diagnosis not present

## 2023-11-21 DIAGNOSIS — J449 Chronic obstructive pulmonary disease, unspecified: Secondary | ICD-10-CM | POA: Diagnosis not present

## 2023-11-21 MED ORDER — SODIUM CHLORIDE 0.9 % IV SOLN: 500.0000 mL | Freq: Once | INTRAVENOUS | Status: DC

## 2023-11-21 NOTE — Patient Instructions (Signed)
Please read handouts provided. Continue present medications. Await pathology results. Repeat colonoscopy in 1 year for screening.   YOU HAD AN ENDOSCOPIC PROCEDURE TODAY AT THE [facility]:   Refer to the procedure report that was given to you for any specific questions about what was found during the examination.  If the procedure report does not answer your questions, please call your gastroenterologist to clarify.  If you requested that your care partner not be given the details of your procedure findings, then the procedure report has been included in a sealed envelope for you to review at your convenience later.  YOU SHOULD EXPECT: Some feelings of bloating in the abdomen. Passage of more gas than usual.  Walking can help get rid of the air that was put into your GI tract during the procedure and reduce the bloating. If you had a lower endoscopy (such as a colonoscopy or flexible sigmoidoscopy) you may notice spotting of blood in your stool or on the toilet paper. If you underwent a bowel prep for your procedure, you may not have a normal bowel movement for a few days.  Please Note:  You might notice some irritation and congestion in your nose or some drainage.  This is from the oxygen used during your procedure.  There is no need for concern and it should clear up in a day or so.  SYMPTOMS TO REPORT IMMEDIATELY:  Following lower endoscopy (colonoscopy or flexible sigmoidoscopy):  Excessive amounts of blood in the stool  Significant tenderness or worsening of abdominal pains  Swelling of the abdomen that is new, acute  Fever of 100F or higher   For urgent or emergent issues, a gastroenterologist can be reached at any hour by calling [phone]. Do not use MyChart messaging for urgent concerns.    DIET:  We do recommend a small meal at first, but then you may proceed to your regular diet.  Drink plenty of fluids but you should avoid alcoholic beverages for 24  hours.  ACTIVITY:  You should plan to take it easy for the rest of today and you should NOT DRIVE or use heavy machinery until tomorrow (because of the sedation medicines used during the test).    FOLLOW UP: Our staff will call the number listed on your records the next business day following your procedure.  We will call around 7:15- 8:00 am to check on you and address any questions or concerns that you may have regarding the information given to you following your procedure. If we do not reach you, we will leave a message.     If any biopsies were taken you will be contacted by phone or by letter within the next 1-3 weeks.  Please call us at [phone] if you have not heard about the biopsies in 3 weeks.    SIGNATURES/CONFIDENTIALITY: You and/or your care partner have signed paperwork which will be entered into your electronic medical record.  These signatures attest to the fact that that the information above on your After Visit Summary has been reviewed and is understood.  Full responsibility of the confidentiality of this discharge information lies with you and/or your care-partner.

## 2023-11-21 NOTE — Op Note (Signed)
 [facility] Patient Name: Darlene Humphrey Procedure Date: 11/21/2023 9:18 AM MRN: [id number] Endoscopist: Sergio Dandy , MD, [id number] Age: 63 Referring MD:  Date of Birth: 10/22/1960 Gender: Female Account #: [id number] Procedure:                Colonoscopy Indications:              High risk colon cancer surveillance: Personal                            history of colonic polyps Medicines:                Monitored Anesthesia Care Procedure:                Pre-Anesthesia Assessment:                           - Prior to the procedure, a History and Physical                            was performed, and patient medications and                            allergies were reviewed. The patient's tolerance of                            previous anesthesia was also reviewed. The risks                            and benefits of the procedure and the sedation                            options and risks were discussed with the patient.                            All questions were answered, and informed consent                            was obtained. Prior Anticoagulants: The patient has                            taken no anticoagulant or antiplatelet agents. ASA                            Grade Assessment: II - A patient with mild systemic                            disease. After reviewing the risks and benefits,                            the patient was deemed in satisfactory condition to                            undergo the procedure.  After obtaining informed consent, the colonoscope                            was passed under direct vision. Throughout the                            procedure, the patient's blood pressure, pulse, and                            oxygen saturations were monitored continuously. The                            Olympus Scope [id number] was introduced through the                            anus and advanced to the the cecum,  identified by                            appendiceal orifice and ileocecal valve. The                            colonoscopy was performed without difficulty. The                            patient tolerated the procedure well. The quality                            of the bowel preparation was good. The ileocecal                            valve, appendiceal orifice, and rectum were                            photographed. Scope In: 9:29:19 AM Scope Out: 10:00:38 AM Scope Withdrawal Time: 0 hours 26 minutes 5 seconds  Total Procedure Duration: 0 hours 31 minutes 19 seconds  Findings:                 The perianal and digital rectal examinations were                            normal.                           A 2 mm polyp was found in the cecum. The polyp was                            sessile. The polyp was removed with a cold biopsy                            forceps. Resection and retrieval were complete.                           Multiple (17) sessile polyps were found in the  rectum X 6 , sigmoid colon X 4, transverse colon X                            3 and ascending colon X 4. The polyps were 3 to 12                            mm in size. These polyps were removed with a cold                            snare. Resection and retrieval were complete.                           Scattered small-mouthed diverticula were found in                            the sigmoid colon, descending colon and ascending                            colon.                           Non-bleeding external and internal hemorrhoids were                            found during retroflexion. The hemorrhoids were                            medium-sized. Complications:            No immediate complications. Estimated Blood Loss:     Estimated blood loss was minimal. Impression:               - One 2 mm polyp in the cecum, removed with a cold                            biopsy forceps.  Resected and retrieved.                           - Multiple 3 to 12 mm polyps in the rectum, in the                            sigmoid colon, in the transverse colon and in the                            ascending colon, removed with a cold snare.                            Resected and retrieved.                           - Moderate diverticulosis in the sigmoid colon, in                            the descending colon and in the ascending  colon.                           - Non-bleeding external and internal hemorrhoids. Recommendation:           - Patient has a contact number available for                            emergencies. The signs and symptoms of potential                            delayed complications were discussed with the                            patient. Return to normal activities tomorrow.                            Written discharge instructions were provided to the                            patient.                           - Resume previous diet.                           - Continue present medications.                           - Await pathology results.                           - Repeat colonoscopy in 1 year for surveillance. Tyarra Nolton V. Zoe Creasman, MD 11/21/2023 10:06:05 AM This report has been signed electronically.

## 2023-11-21 NOTE — Progress Notes (Unsigned)
 [facility] History and Physical   Primary Care Physician:  Dorena Gander, MD   Reason for Procedure:  History of adenomatous colon polyps  Plan:    Surveillance colonoscopy with possible interventions as needed     HPI: Darlene Humphrey is a very pleasant 62 y.o. female here for surveillance colonoscopy. Denies any nausea, vomiting, abdominal pain, melena or bright red blood per rectum  The risks and benefits as well as alternatives of endoscopic procedure(s) have been discussed and reviewed. All questions answered. The patient agrees to proceed.    Past Medical History:  Diagnosis Date   Anxiety    COPD (chronic obstructive pulmonary disease) (HCC)    Hypertension     Past Surgical History:  Procedure Laterality Date   GANGLION CYST EXCISION Right 1995   wrist   RETINAL DETACHMENT SURGERY Left 1985   TOOTH EXTRACTION N/A 11/03/2023   Procedure: EXTRACTION TEETH SIX, SEVEN, EIGHT, TEN, ELEVEN, TWELVE, THIRTEEN, FOURTEEN, FIFTEEN, TWENTY-ONE, TWENTY-TWO, TWENTY-THREE, TWENTY-FOUR, TWENTY-FIVE, TWENTY-SIX, TWENTY-SEVEN, TWENTY-EIGHT, AND TWENTY-NINE; ALVEOLOPLASTY; REMOVAL RIGHT MANDIBULAR BUCCAL EXOSTOSIS;  Surgeon: Ascencion Lava, DMD;  Location: [facility] OR;  Service: Oral Surgery;  Laterality: N/A;    Prior to Admission medications   Medication Sig Start Date End Date Taking? Authorizing Provider  amLODipine (NORVASC) 10 MG tablet Take 10 mg by mouth daily. 01/23/20  Yes [provider]  aspirin EC 81 MG tablet Take 81 mg by mouth daily. Swallow whole.   Yes [provider]  budesonide-formoterol (SYMBICORT) 160-4.5 MCG/ACT inhaler Inhale 2 puffs into the lungs 2 (two) times daily.   Yes [provider]  Calcium Carb-Cholecalciferol (CALCIUM 600+D3) 600-20 MG-MCG TABS Take 1 tablet by mouth daily.   Yes [provider]  escitalopram (LEXAPRO) 5 MG tablet Take 5 mg by mouth daily. 04/26/21  Yes [provider]  oxyCODONE  (OXY  IR/ROXICODONE ) 5 MG immediate release tablet Take 1 tablet (5 mg total) by mouth every 6 (six) hours as needed. 11/03/23  Yes Ascencion Lava, DMD  amoxicillin  (AMOXIL ) 500 MG capsule Take 1 capsule (500 mg total) by mouth 3 (three) times daily. 11/03/23   Ascencion Lava, DMD  ondansetron  (ZOFRAN ) 4 MG tablet Take 1 tablet (4 mg total) by mouth every 8 (eight) hours as needed for nausea or vomiting. 11/03/23   Ascencion Lava, DMD    Current Outpatient Medications  Medication Sig Dispense Refill   amLODipine (NORVASC) 10 MG tablet Take 10 mg by mouth daily.     aspirin EC 81 MG tablet Take 81 mg by mouth daily. Swallow whole.     budesonide-formoterol (SYMBICORT) 160-4.5 MCG/ACT inhaler Inhale 2 puffs into the lungs 2 (two) times daily.     Calcium Carb-Cholecalciferol (CALCIUM 600+D3) 600-20 MG-MCG TABS Take 1 tablet by mouth daily.     escitalopram (LEXAPRO) 5 MG tablet Take 5 mg by mouth daily.     oxyCODONE  (OXY IR/ROXICODONE ) 5 MG immediate release tablet Take 1 tablet (5 mg total) by mouth every 6 (six) hours as needed. 20 tablet 0   amoxicillin  (AMOXIL ) 500 MG capsule Take 1 capsule (500 mg total) by mouth 3 (three) times daily. 21 capsule 0   ondansetron  (ZOFRAN ) 4 MG tablet Take 1 tablet (4 mg total) by mouth every 8 (eight) hours as needed for nausea or vomiting. 12 tablet 1   Current Facility-Administered Medications  Medication Dose Route Frequency Provider Last Rate Last Admin   0.9 %  sodium chloride  infusion  500 mL Intravenous Once [name]  V, MD        Allergies as of 11/21/2023   (No Known Allergies)    Family History  Problem Relation Age of Onset   Colon cancer Neg Hx    Stomach cancer Neg Hx    Rectal cancer Neg Hx    Colon polyps Neg Hx    Esophageal cancer Neg Hx     Social History   Socioeconomic History   Marital status: Single    Spouse name: Not on file   Number of children: Not on file   Years of education: Not on file   Highest education level:  Not on file  Occupational History   Not on file  Tobacco Use   Smoking status: Every Day    Current packs/day: 0.25    Types: Cigarettes   Smokeless tobacco: Never   Tobacco comments:    Maybe 2 cigarettes per day (11/01/23)  Vaping Use   Vaping status: Never Used  Substance and Sexual Activity   Alcohol use: Yes    Alcohol/week: 3.0 standard drinks of alcohol    Types: 3 Cans of beer per week    Comment: 3 cans of beer per day   Drug use: No   Sexual activity: Not on file  Other Topics Concern   Not on file  Social History Narrative   Not on file   Social Drivers of Health   Financial Resource Strain: Not on file  Food Insecurity: No Food Insecurity (12/08/2021)   Received from [facility], [facility]   Hunger Vital Sign    Worried About Running Out of Food in the Last Year: Never true    Ran Out of Food in the Last Year: Never true  Transportation Needs: Not on file  Physical Activity: Not on file  Stress: Not on file  Social Connections: Unknown (11/30/2021)   Received from [facility], [facility]   Social Network    Social Network: Not on file  Intimate Partner Violence: Unknown (10/22/2021)   Received from [facility], [facility]   HITS    Physically Hurt: Not on file    Insult or Talk Down To: Not on file    Threaten Physical Harm: Not on file    Scream or Curse: Not on file    Review of Systems:  All other review of systems negative except as mentioned in the HPI.  Physical Exam: Vital signs in last 24 hours: BP 131/80   Pulse (!) 56   Temp 98.2 F (36.8 C)   Ht 5\' 10"  (1.778 m)   Wt 140 lb (63.5 kg)   SpO2 95%   BMI 20.09 kg/m  General:   Alert, NAD Lungs:  Clear .   Heart:  Regular rate and rhythm Abdomen:  Soft, nontender and nondistended. Neuro/Psych:  Alert and cooperative. Normal mood and affect. A and O x 3  Reviewed labs, radiology imaging, old records and pertinent past GI work up  Patient is appropriate for planned  procedure(s) and anesthesia in an ambulatory setting   [name] , MD [phone]

## 2023-11-21 NOTE — Telephone Encounter (Signed)
 I left a voicemail with the new address of Dr. Soldatova.

## 2023-11-21 NOTE — Progress Notes (Unsigned)
 Pt's states no medical or surgical changes since previsit or office visit.

## 2023-11-21 NOTE — Progress Notes (Unsigned)
 Report to PACU, RN, vss, BBS= Clear.

## 2023-11-21 NOTE — Progress Notes (Signed)
 Called to room to assist during endoscopic procedure.  Patient ID and intended procedure confirmed with present staff. Received instructions for my participation in the procedure from the performing physician.

## 2023-11-22 ENCOUNTER — Telehealth: Payer: Self-pay

## 2023-11-22 NOTE — Telephone Encounter (Signed)
  Follow up Call-     11/21/2023    8:20 AM  Call back number  Post procedure Call Back phone  # [phone]  Permission to leave phone message Yes     Patient questions:  Do you have a fever, pain , or abdominal swelling? No. Pain Score  0 *  Have you tolerated food without any problems? Yes.    Have you been able to return to your normal activities? Yes.    Do you have any questions about your discharge instructions: Diet   No. Medications  No. Follow up visit  No.  Do you have questions or concerns about your Care? No.  Actions: * If pain score is 4 or above: No action needed, pain <4.

## 2023-11-23 ENCOUNTER — Ambulatory Visit (INDEPENDENT_AMBULATORY_CARE_PROVIDER_SITE_OTHER): Admitting: Otolaryngology

## 2023-11-23 ENCOUNTER — Encounter (INDEPENDENT_AMBULATORY_CARE_PROVIDER_SITE_OTHER): Payer: Self-pay | Admitting: Otolaryngology

## 2023-11-23 VITALS — BP 119/77 | HR 62 | Ht 70.0 in | Wt 140.0 lb

## 2023-11-23 DIAGNOSIS — K219 Gastro-esophageal reflux disease without esophagitis: Secondary | ICD-10-CM

## 2023-11-23 DIAGNOSIS — F17210 Nicotine dependence, cigarettes, uncomplicated: Secondary | ICD-10-CM

## 2023-11-23 DIAGNOSIS — J351 Hypertrophy of tonsils: Secondary | ICD-10-CM

## 2023-11-23 DIAGNOSIS — R590 Localized enlarged lymph nodes: Secondary | ICD-10-CM | POA: Diagnosis not present

## 2023-11-23 DIAGNOSIS — Z720 Tobacco use: Secondary | ICD-10-CM

## 2023-11-23 LAB — SURGICAL PATHOLOGY

## 2023-11-23 NOTE — Progress Notes (Signed)
 ENT CONSULT:  Reason for Consult: right sided neck mass node "for years"  HPI: Discussed the use of AI scribe software for clinical note transcription with the patient, who gave verbal consent to proceed.  History of Present Illness Darlene Humphrey is a 63 year old female currently smoking, but cutting down and now only smokes one cigarette per day, who presents with a persistent neck lump right side of her neck. She was referred by Dr. Barbar Bonus for evaluation of a neck lump.   She has had a lump on the right side of her neck that has been present for years. There are no associated symptoms such as sore throat, difficulty swallowing, or changes in voice, hemoptysis. The lump has not caused pain or increased in size over time.  She has a significant history of smoking but has reduced her smoking to about one cigarette per day.  She has a history of recurrent sore throats during childhood, but her tonsils were never removed.    Past Medical History:  Diagnosis Date   Anxiety    COPD (chronic obstructive pulmonary disease) (HCC)    Hypertension     Past Surgical History:  Procedure Laterality Date   GANGLION CYST EXCISION Right 1995   wrist   RETINAL DETACHMENT SURGERY Left 1985   TOOTH EXTRACTION N/A 11/03/2023   Procedure: EXTRACTION TEETH SIX, SEVEN, EIGHT, TEN, ELEVEN, TWELVE, THIRTEEN, FOURTEEN, FIFTEEN, TWENTY-ONE, TWENTY-TWO, TWENTY-THREE, TWENTY-FOUR, TWENTY-FIVE, TWENTY-SIX, TWENTY-SEVEN, TWENTY-EIGHT, AND TWENTY-NINE; ALVEOLOPLASTY; REMOVAL RIGHT MANDIBULAR BUCCAL EXOSTOSIS;  Surgeon: Ascencion Lava, DMD;  Location: [facility] OR;  Service: Oral Surgery;  Laterality: N/A;    Family History  Problem Relation Age of Onset   Colon cancer Neg Hx    Stomach cancer Neg Hx    Rectal cancer Neg Hx    Colon polyps Neg Hx    Esophageal cancer Neg Hx     Social History:  reports that she has been smoking cigarettes. She has never used smokeless tobacco. She reports current alcohol use of  about 3.0 standard drinks of alcohol per week. She reports that she does not use drugs.  Allergies: No Known Allergies  Medications: I have reviewed the patient's current medications.  The PMH, PSH, Medications, Allergies, and SH were reviewed and updated.  ROS: Constitutional: Negative for fever, weight loss and weight gain. Cardiovascular: Negative for chest pain and dyspnea on exertion. Respiratory: Is not experiencing shortness of breath at rest. Gastrointestinal: Negative for nausea and vomiting. Neurological: Negative for headaches. Psychiatric: The patient is not nervous/anxious  Blood pressure 119/77, pulse 62, height 5\' 10"  (1.778 m), weight 140 lb (63.5 kg), SpO2 95%. Body mass index is 20.09 kg/m.  PHYSICAL EXAM:  Exam: General: Well-developed, well-nourished Communication and Voice: Clear pitch and clarity Respiratory Respiratory effort: Equal inspiration and expiration without stridor Cardiovascular Peripheral Vascular: Warm extremities with equal color/perfusion Eyes: No nystagmus with equal extraocular motion bilaterally Neuro/Psych/Balance: Patient oriented to person, place, and time; Appropriate mood and affect; Gait is intact with no imbalance; Cranial nerves I-XII are intact Head and Face Inspection: Normocephalic and atraumatic without mass or lesion Palpation: Facial skeleton intact without bony stepoffs Salivary Glands: No mass or tenderness Facial Strength: Facial motility symmetric and full bilaterally ENT Pinna: External ear intact and fully developed External canal: Canal is patent with intact skin Tympanic Membrane: Clear and mobile External Nose: No scar or anatomic deformity Internal Nose: Septum is relatively straight. No polyp, or purulence. Mucosal edema and erythema present.  Bilateral inferior turbinate  hypertrophy.  Lips, Teeth, and gums: Mucosa and teeth intact and viable TMJ: No pain to palpation with full mobility Oral  cavity/oropharynx: No erythema or exudate, no lesions present 2+ tonsillar hypertrophy Nasopharynx: No mass or lesion with intact mucosa Hypopharynx: Intact mucosa without pooling of secretions Larynx Glottic: Full true vocal cord mobility without lesion or mass Supraglottic: Normal appearing epiglottis and AE folds Interarytenoid Space: Moderate pachydermia&edema Subglottic Space: Patent without lesion or edema Neck Neck and Trachea: Midline trachea without mass or lesion Thyroid: No mass or nodularity Lymphatics: Palpable mass lymphadenopathy 1-2 cm in size just posterior to the right SMG, mobile, no overlying skin changes   Procedure: Preoperative diagnosis: right neck mass  Postoperative diagnosis:   Same + 2+ symmetric tonsils seen no masses or lesions GERD LPR  Procedure: Flexible fiberoptic laryngoscopy  Surgeon: Artice Last, MD  Anesthesia: Topical lidocaine  and Afrin Complications: None Condition is stable throughout exam  Indications and consent:  The patient presents to the clinic with above symptoms. Indirect laryngoscopy view was incomplete. Thus it was recommended that they undergo a flexible fiberoptic laryngoscopy. All of the risks, benefits, and potential complications were reviewed with the patient preoperatively and verbal informed consent was obtained.  Procedure: The patient was seated upright in the clinic. Topical lidocaine  and Afrin were applied to the nasal cavity. After adequate anesthesia had occurred, I then proceeded to pass the flexible telescope into the nasal cavity. The nasal cavity was patent without rhinorrhea or polyp. The nasopharynx was also patent without mass or lesion. The base of tongue was visualized and was normal. There were no signs of pooling of secretions in the piriform sinuses. The true vocal folds were mobile bilaterally. There were no signs of glottic or supraglottic mucosal lesion or mass. There was moderate interarytenoid  pachydermia and post cricoid edema. The telescope was then slowly withdrawn and the patient tolerated the procedure throughout.    Studies Reviewed: CT chest 09/13/23 IMPRESSION: 1. Lung-RADS 2, benign appearance or behavior. Continue annual screening with low-dose chest CT without contrast in 12 months. 2. Aortic atherosclerosis (ICD10-I70.0). Coronary artery calcification. 3.  Emphysema (ICD10-J43.9).    Assessment/Plan: Encounter Diagnoses  Name Primary?   Tobacco abuse    Cervical lymphadenopathy Yes   Tonsillar hypertrophy    Chronic GERD     Assessment and Plan Assessment & Plan Right neck mass/possible cervical lymphadenopathy right level 2 Chronic right neck mass present for years, stable in size, flexible scope exam today without masses or lesions to suggest head and neck cancer. Differential includes benign lymphadenopathy or other benign masses vs neoplasm. No imaging in the past.  - Order neck ultrasound to assess  - Consider neck CT if ultrasound inconclusive or concerning. - Plan needle biopsy if imaging suggests suspicious features. - Follow-up in two months to review ultrasound results.  Smoking Tobacco Use Disorder Tobacco use disorder.  We had an extensive discussion about detrimental effects of smoking on overall health. I provided resources available at [facility] to assist with smoking cessation. I spent 4 min on counseling  - Advised smoking cessation  U/S neck  RTC 2 mo  Thank you for allowing me to participate in the care of this patient. Please do not hesitate to contact me with any questions or concerns.   Artice Last, MD Otolaryngology [facility] Phone: [phone] Fax: [phone]    11/23/2023, 3:47 PM

## 2023-12-22 ENCOUNTER — Ambulatory Visit (HOSPITAL_COMMUNITY)

## 2023-12-25 ENCOUNTER — Ambulatory Visit: Payer: Self-pay | Admitting: Gastroenterology

## 2024-01-02 DIAGNOSIS — R350 Frequency of micturition: Secondary | ICD-10-CM | POA: Diagnosis not present

## 2024-01-02 DIAGNOSIS — N898 Other specified noninflammatory disorders of vagina: Secondary | ICD-10-CM | POA: Diagnosis not present

## 2024-01-02 DIAGNOSIS — N7689 Other specified inflammation of vagina and vulva: Secondary | ICD-10-CM | POA: Diagnosis not present

## 2024-01-26 ENCOUNTER — Telehealth (INDEPENDENT_AMBULATORY_CARE_PROVIDER_SITE_OTHER): Payer: Self-pay

## 2024-01-26 NOTE — Telephone Encounter (Signed)
 Spoke with patient regarding upcoming f/u. Patient was supposed to get neck U/S but has been having problems with transportation. I told the patient that she could call them and schedule her imaging and then give us  a call back. I gave the patient the number for radiology. Patient understood. (Please cancel her appointment.)

## 2024-01-30 ENCOUNTER — Ambulatory Visit (INDEPENDENT_AMBULATORY_CARE_PROVIDER_SITE_OTHER): Admitting: Otolaryngology

## 2024-02-13 DIAGNOSIS — Z8619 Personal history of other infectious and parasitic diseases: Secondary | ICD-10-CM | POA: Diagnosis not present

## 2024-02-13 DIAGNOSIS — E7800 Pure hypercholesterolemia, unspecified: Secondary | ICD-10-CM | POA: Diagnosis not present

## 2024-02-13 DIAGNOSIS — N898 Other specified noninflammatory disorders of vagina: Secondary | ICD-10-CM | POA: Diagnosis not present

## 2024-02-13 DIAGNOSIS — F411 Generalized anxiety disorder: Secondary | ICD-10-CM | POA: Diagnosis not present

## 2024-02-13 DIAGNOSIS — J439 Emphysema, unspecified: Secondary | ICD-10-CM | POA: Diagnosis not present

## 2024-02-13 DIAGNOSIS — I10 Essential (primary) hypertension: Secondary | ICD-10-CM | POA: Diagnosis not present

## 2024-08-07 ENCOUNTER — Other Ambulatory Visit: Payer: Self-pay | Admitting: Family Medicine

## 2024-08-07 DIAGNOSIS — Z1231 Encounter for screening mammogram for malignant neoplasm of breast: Secondary | ICD-10-CM

## 2024-08-23 ENCOUNTER — Ambulatory Visit: Admission: RE | Admit: 2024-08-23 | Source: Ambulatory Visit

## 2024-08-23 DIAGNOSIS — Z1231 Encounter for screening mammogram for malignant neoplasm of breast: Secondary | ICD-10-CM

## 2024-09-12 ENCOUNTER — Other Ambulatory Visit
